# Patient Record
Sex: Female | Born: 1965 | Race: White | Hispanic: No | Marital: Single | Smoking: Current every day smoker
Health system: Southern US, Community
[De-identification: ages and names within clinical notes are randomized; demographics above are authoritative.]

## PROBLEM LIST (undated history)

## (undated) DIAGNOSIS — F329 Major depressive disorder, single episode, unspecified: Secondary | ICD-10-CM

## (undated) DIAGNOSIS — F32A Depression, unspecified: Secondary | ICD-10-CM

## (undated) DIAGNOSIS — M549 Dorsalgia, unspecified: Secondary | ICD-10-CM

## (undated) DIAGNOSIS — IMO0002 Reserved for concepts with insufficient information to code with codable children: Secondary | ICD-10-CM

## (undated) DIAGNOSIS — K279 Peptic ulcer, site unspecified, unspecified as acute or chronic, without hemorrhage or perforation: Secondary | ICD-10-CM

## (undated) DIAGNOSIS — F319 Bipolar disorder, unspecified: Secondary | ICD-10-CM

## (undated) DIAGNOSIS — G8929 Other chronic pain: Secondary | ICD-10-CM

## (undated) HISTORY — PX: TUBAL LIGATION: SHX77

---

## 2003-10-15 ENCOUNTER — Emergency Department (HOSPITAL_COMMUNITY): Admission: EM | Admit: 2003-10-15 | Discharge: 2003-10-15 | Payer: Self-pay | Admitting: Emergency Medicine

## 2003-12-09 ENCOUNTER — Emergency Department (HOSPITAL_COMMUNITY): Admission: EM | Admit: 2003-12-09 | Discharge: 2003-12-09 | Payer: Self-pay | Admitting: Emergency Medicine

## 2006-07-04 ENCOUNTER — Emergency Department (HOSPITAL_COMMUNITY): Admission: EM | Admit: 2006-07-04 | Discharge: 2006-07-04 | Payer: Self-pay | Admitting: Emergency Medicine

## 2007-03-03 ENCOUNTER — Emergency Department (HOSPITAL_COMMUNITY): Admission: EM | Admit: 2007-03-03 | Discharge: 2007-03-03 | Payer: Self-pay | Admitting: Emergency Medicine

## 2007-04-30 ENCOUNTER — Emergency Department (HOSPITAL_COMMUNITY): Admission: EM | Admit: 2007-04-30 | Discharge: 2007-04-30 | Payer: Self-pay | Admitting: Emergency Medicine

## 2007-07-19 ENCOUNTER — Emergency Department (HOSPITAL_COMMUNITY): Admission: EM | Admit: 2007-07-19 | Discharge: 2007-07-19 | Payer: Self-pay | Admitting: Emergency Medicine

## 2007-08-18 ENCOUNTER — Emergency Department (HOSPITAL_COMMUNITY): Admission: EM | Admit: 2007-08-18 | Discharge: 2007-08-19 | Payer: Self-pay | Admitting: Emergency Medicine

## 2007-10-03 ENCOUNTER — Ambulatory Visit: Payer: Self-pay | Admitting: Internal Medicine

## 2007-10-24 ENCOUNTER — Ambulatory Visit: Payer: Self-pay | Admitting: Internal Medicine

## 2007-10-24 ENCOUNTER — Ambulatory Visit (HOSPITAL_COMMUNITY): Admission: RE | Admit: 2007-10-24 | Discharge: 2007-10-24 | Payer: Self-pay | Admitting: Internal Medicine

## 2007-11-14 ENCOUNTER — Emergency Department (HOSPITAL_COMMUNITY): Admission: EM | Admit: 2007-11-14 | Discharge: 2007-11-15 | Payer: Self-pay | Admitting: Emergency Medicine

## 2007-11-16 ENCOUNTER — Emergency Department (HOSPITAL_COMMUNITY): Admission: EM | Admit: 2007-11-16 | Discharge: 2007-11-16 | Payer: Self-pay | Admitting: Emergency Medicine

## 2007-11-22 ENCOUNTER — Emergency Department (HOSPITAL_COMMUNITY): Admission: EM | Admit: 2007-11-22 | Discharge: 2007-11-22 | Payer: Self-pay | Admitting: Emergency Medicine

## 2008-02-04 ENCOUNTER — Emergency Department (HOSPITAL_COMMUNITY): Admission: EM | Admit: 2008-02-04 | Discharge: 2008-02-04 | Payer: Self-pay | Admitting: Emergency Medicine

## 2008-06-30 ENCOUNTER — Emergency Department (HOSPITAL_COMMUNITY): Admission: EM | Admit: 2008-06-30 | Discharge: 2008-06-30 | Payer: Self-pay | Admitting: Emergency Medicine

## 2008-09-22 ENCOUNTER — Emergency Department (HOSPITAL_COMMUNITY): Admission: EM | Admit: 2008-09-22 | Discharge: 2008-09-22 | Payer: Self-pay | Admitting: Emergency Medicine

## 2009-03-23 ENCOUNTER — Emergency Department (HOSPITAL_COMMUNITY): Admission: EM | Admit: 2009-03-23 | Discharge: 2009-03-23 | Payer: Self-pay | Admitting: Emergency Medicine

## 2009-07-01 ENCOUNTER — Ambulatory Visit (HOSPITAL_COMMUNITY): Admission: RE | Admit: 2009-07-01 | Discharge: 2009-07-01 | Payer: Self-pay | Admitting: Family Medicine

## 2009-09-12 ENCOUNTER — Emergency Department (HOSPITAL_COMMUNITY): Admission: EM | Admit: 2009-09-12 | Discharge: 2009-09-12 | Payer: Self-pay | Admitting: Emergency Medicine

## 2009-10-08 ENCOUNTER — Emergency Department (HOSPITAL_COMMUNITY): Admission: EM | Admit: 2009-10-08 | Discharge: 2009-10-08 | Payer: Self-pay | Admitting: Emergency Medicine

## 2010-03-18 ENCOUNTER — Emergency Department (HOSPITAL_COMMUNITY)
Admission: EM | Admit: 2010-03-18 | Discharge: 2010-03-18 | Payer: Self-pay | Source: Home / Self Care | Admitting: Emergency Medicine

## 2010-03-18 LAB — URINALYSIS, ROUTINE W REFLEX MICROSCOPIC
Bilirubin Urine: NEGATIVE
Ketones, ur: NEGATIVE mg/dL
Leukocytes, UA: NEGATIVE
Nitrite: NEGATIVE
Protein, ur: NEGATIVE mg/dL
Specific Gravity, Urine: 1.02 (ref 1.005–1.030)
Urine Glucose, Fasting: NEGATIVE mg/dL
Urobilinogen, UA: 0.2 mg/dL (ref 0.0–1.0)
pH: 6 (ref 5.0–8.0)

## 2010-03-18 LAB — URINE MICROSCOPIC-ADD ON

## 2010-06-20 LAB — URINE MICROSCOPIC-ADD ON

## 2010-06-20 LAB — URINALYSIS, ROUTINE W REFLEX MICROSCOPIC
Bilirubin Urine: NEGATIVE
Glucose, UA: NEGATIVE mg/dL
Ketones, ur: NEGATIVE mg/dL
Leukocytes, UA: NEGATIVE
Nitrite: NEGATIVE
Protein, ur: NEGATIVE mg/dL
Specific Gravity, Urine: 1.02 (ref 1.005–1.030)
Urobilinogen, UA: 0.2 mg/dL (ref 0.0–1.0)
pH: 6 (ref 5.0–8.0)

## 2010-06-23 LAB — URINALYSIS, ROUTINE W REFLEX MICROSCOPIC
Glucose, UA: NEGATIVE mg/dL
Ketones, ur: NEGATIVE mg/dL
Nitrite: POSITIVE — AB
Protein, ur: 100 mg/dL — AB
Specific Gravity, Urine: 1.025 (ref 1.005–1.030)
Urobilinogen, UA: 2 mg/dL — ABNORMAL HIGH (ref 0.0–1.0)
pH: 6 (ref 5.0–8.0)

## 2010-06-23 LAB — COMPREHENSIVE METABOLIC PANEL
ALT: 19 U/L (ref 0–35)
AST: 19 U/L (ref 0–37)
Albumin: 3 g/dL — ABNORMAL LOW (ref 3.5–5.2)
Alkaline Phosphatase: 56 U/L (ref 39–117)
BUN: 17 mg/dL (ref 6–23)
CO2: 24 mEq/L (ref 19–32)
Calcium: 9 mg/dL (ref 8.4–10.5)
Chloride: 95 mEq/L — ABNORMAL LOW (ref 96–112)
Creatinine, Ser: 1.32 mg/dL — ABNORMAL HIGH (ref 0.4–1.2)
GFR calc Af Amer: 53 mL/min — ABNORMAL LOW (ref >60)
GFR calc non Af Amer: 44 mL/min — ABNORMAL LOW (ref >60)
Glucose, Bld: 180 mg/dL — ABNORMAL HIGH (ref 70–99)
Potassium: 3.1 mEq/L — ABNORMAL LOW (ref 3.5–5.1)
Sodium: 131 mEq/L — ABNORMAL LOW (ref 135–145)
Total Bilirubin: 0.7 mg/dL (ref 0.3–1.2)
Total Protein: 7.1 g/dL (ref 6.0–8.3)

## 2010-06-23 LAB — DIFFERENTIAL
Basophils Absolute: 0 10*3/uL (ref 0.0–0.1)
Basophils Relative: 0 % (ref 0–1)
Eosinophils Absolute: 0 10*3/uL (ref 0.0–0.7)
Eosinophils Relative: 0 % (ref 0–5)
Lymphocytes Relative: 4 % — ABNORMAL LOW (ref 12–46)
Lymphs Abs: 0.4 10*3/uL — ABNORMAL LOW (ref 0.7–4.0)
Monocytes Absolute: 1.8 10*3/uL — ABNORMAL HIGH (ref 0.1–1.0)
Monocytes Relative: 15 % — ABNORMAL HIGH (ref 3–12)
Neutro Abs: 9.8 10*3/uL — ABNORMAL HIGH (ref 1.7–7.7)
Neutrophils Relative %: 82 % — ABNORMAL HIGH (ref 43–77)

## 2010-06-23 LAB — CBC
HCT: 41.9 % (ref 36.0–46.0)
Hemoglobin: 14.6 g/dL (ref 12.0–15.0)
MCHC: 34.9 g/dL (ref 30.0–36.0)
MCV: 104.5 fL — ABNORMAL HIGH (ref 78.0–100.0)
Platelets: 165 10*3/uL (ref 150–400)
RBC: 4.01 MIL/uL (ref 3.87–5.11)
RDW: 12.7 % (ref 11.5–15.5)
WBC: 12 10*3/uL — ABNORMAL HIGH (ref 4.0–10.5)

## 2010-06-23 LAB — URINE CULTURE: Colony Count: >=100000

## 2010-06-23 LAB — PROTIME-INR
INR: 1 (ref 0.00–1.49)
Prothrombin Time: 13 seconds (ref 11.6–15.2)

## 2010-06-23 LAB — CULTURE, BLOOD (ROUTINE X 2)
Culture: NO GROWTH
Culture: NO GROWTH
Report Status: 4242010
Report Status: 4242010

## 2010-06-23 LAB — URINE MICROSCOPIC-ADD ON

## 2010-06-23 LAB — LIPASE, BLOOD: Lipase: 21 U/L (ref 11–59)

## 2010-07-27 NOTE — Consult Note (Signed)
NAME:  Natalie Fleming, Natalie Fleming                  ACCOUNT NO.:  [id number]   MEDICAL RECORD NO.:  [id number]          PATIENT TYPE:  AMB   LOCATION:  DAY                           FACILITY:  [facility]   PHYSICIAN:  R. Roetta Sessions, M.D. DATE OF BIRTH:  26-Nov-1965   DATE OF CONSULTATION:  DATE OF DISCHARGE:                                 CONSULTATION   REQUESTING PHYSICIAN:  [name], nurse practitioner from  [name] Department.   REASON FOR CONSULTATION:  Significant weight loss, abdominal pain,  nausea, vomiting, and hematochezia.   HISTORY OF PRESENT ILLNESS:  Natalie Fleming is a 45 year old Caucasian female.  She tells me over the last 7 months, she has had significant GI  problems.  She complains of multiple symptoms including early a.m.  nausea that resolved throughout the day and returns again in the  evenings.  She complains of mid abdominal pain, which she describes as a  fire burning type pain, 10/10 on pain scale.  She has constant daily  pain.  She also notes cold chills.  She notes vomiting several times per  day.  She complains of anorexia.  She denies any dysphagia or  odynophagia.  She occasionally has heartburn and indigestion.  She takes  3-4 ibuprofen per day as well as Goody's and BC powders very regularly  for migraine headaches.  She occasionally notices small to moderate  amounts of bright red blood in her stool and has even noted dark stools,  which may have been melena, intermittently over the course of the last 7  months.  She complains of constipation which has been a chronic problem  for her.  She can go up to 3 days without a bowel movement and this  alternates between occasional diarrhea where she can have up to 4 loose  stools per day.  She has lost 22 pounds in the last 7 months.  This all  started with what she felt was a virus.  She is concerned because her  mother died of some type of abdominal cancer.  She thinks it may have  been gastric.  She had  surgery and developed a staph infection.  She has  tried Zantac, Tagamet, Maalox, and Mylanta, but is not getting complete  relief.   PAST MEDICAL HISTORY:  Tubal ligation.   LABORATORY STUDIES:  From September 17, 2007, showed a hemoglobin of 15.2,  hematocrit 46, MCV 105, WBC is 9.5, platelets 246, and glucose 114.  Otherwise, normal comprehensive metabolic panel.  Amylase and lipase are  normal.   CURRENT MEDICATIONS:  1. Ibuprofen 400 mg p.r.n.  2. BC powders p.r.n.   ALLERGIES:  No known drug allergies.   FAMILY HISTORY:  Positive for mother with some type of stomach cancer  and was succumbed to a staph infection postop.  Her father is alive at  age 69 with prostate cancer and diabetes mellitus.  She has lost one  sister due to gunshot.  She has one half-brother who is healthy.   SOCIAL HISTORY:  Natalie Fleming is separated.  She lives with her  significant  other.  She has 2 grown healthy daughters.  She lost her job at [employer]  last week.  She has a 2-pack year history of tobacco use.  She has a  history of heavy alcohol use previously for about 14 years.  Currently,  she tells me she only drinks a couple of beers per year.  She quit about  1 year ago.  She has history of IV drug use.  She has a 5-year history  of heavy drug use, but quit 20 years ago.   REVIEW OF SYSTEMS:  See HPI.  CONSTITUTIONAL:  She complains of fatigue  and myalgias.  She tells me she never feels well.   PHYSICAL EXAMINATION:  VITAL SIGNS:  Weight 106 pounds, height 64  inches, temperature 98.3, blood pressure 100/78, and pulse 96.  GENERAL:  Natalie Fleming is a thin, cachectic Caucasian female who is alert,  oriented, pleasant, and cooperative, in no acute distress.  HEENT:  Sclerae clear, nonicteric.  Conjunctivae pink.  Oropharynx pink  and moist without any lesions.  NECK:  Supple without mass or thyromegaly.  CHEST:  Heart regular rate and rhythm.  Normal S1 and S2 without  murmurs, clicks, rubs, or  gallops.  LUNGS:  Clear to auscultation bilaterally.  ABDOMEN:  She does have an umbilical ornament as well as lower abdominal  tattoo.  Abdomen has positive bowel sounds x4.  No bruits auscultated.  Soft and nondistended.  She does have mild tenderness to bilateral lower  quadrant in suprapubic area on deep palpation.  There is no rebound,  tenderness, or guarding.  No hepatosplenomegaly or mass.  EXTREMITIES:  Without clubbing or edema.   IMPRESSION:  Natalie Fleming is a 45 year old Caucasian female with significant  abdominal pain, heartburn, indigestion, and significant nonsteroidal  anti-inflammatory drug use including frequent BC or Goody's powders.  She is definitely at risk for peptic ulcer disease or nonsteroidal  antiinflammatory drug-induced damage throughout her entire  gastrointestinal tract.  It is worrisome that she has had a 22-pound  weight loss in the past 7 months.  She also has intermittent  hematochezia, possible history of melena.  We also need to rule out  inflammatory bowel disease in this lady as well as malignancy.   PLAN:  1. Urine pregnancy.  2. Nexium 40 mg daily.  I have given her 2 weeks worth of samples.  3. She is to avoid ibuprofen, Goody's, BC's, or any other NSAIDs and      use Tylenol instead for her headaches or talk with [name],      nurse practitioner, about further management.  4. Colonoscopy and EGD with Dr. Jena Gauss in the OR with propofol given      her history of polysubstance abuse.  I have discussed both      procedures including risks and benefits including but not limited      to bleeding, infection, perforation, and drug reaction.  She agreed      with the plan and consent was obtained.      Natalie Fleming, N.P.      Natalie Fleming, M.D.  Electronically Signed    KJ/MEDQ  D:  10/03/2007  T:  10/04/2007  Job:  [id number]

## 2010-07-27 NOTE — Op Note (Signed)
NAME:  Natalie Fleming, Natalie Fleming                  ACCOUNT NO.:  [id number]   MEDICAL RECORD NO.:  [id number]          PATIENT TYPE:  AMB   LOCATION:  DAY                           FACILITY:  [facility]   PHYSICIAN:  R. Roetta Sessions, M.D. DATE OF BIRTH:  16-Mar-1965   DATE OF PROCEDURE:  10/24/2007  DATE OF DISCHARGE:                               OPERATIVE REPORT   Diagnostic EGD followed by iliac colonoscopy diagnostic.   INDICATIONS FOR PROCEDURE:  A 45 year old lady with abdominal pain,  reflux symptoms, dyspepsia, and the same significant anti-inflammatory  drug use including BCs and Goody's powders.  She also has intermittent  hematochezia and consequently EGD and colonoscopy not been done.  Risks,  benefits, and limitations have been reviewed.  Questions answered.  Urine pregnancy test was negative through our office.  She was given a 2-  week supply of Nexium 40 mg orally daily, which she took and her upper  GI tract symptoms improved significantly while on this regimen, then she  ran out.   PROCEDURE NOTE:  O2 saturation, blood pressure, pulse, and respirations  were monitored throughout the entire procedure.   CONSCIOUS SEDATION/PROPOFOL:  Per anesthesia, Cetacaine spray and  topical pharyngeal anesthesia.   INSTRUMENTATION:  Pentax video chip system.   EGD FINDINGS:  Examination of tubular esophagus revealed entirely normal  esophageal mucosa.  EG junction easily traversed.  Stomach:  All gastric  cavity was emptied and insufflated well with air.  A thorough  examination of  the gastric mucosa including retroflexion of proximal  stomach and esophagogastric junction demonstrated normal-appearing  gastric mucosa.  There was no hiatal hernia.  Pylorus patent and easily  traversed.  Examination of bulb and second portion revealed erosions in  the posterior bulb where some mucosal edema.  I was unable to identify a  ulcer or other abnormality.  The patient tolerated the procedure well  and was  prepared for colonoscopy.  Digital rectal exam revealed external  hemorrhoidal tags only.  Endoscopic findings, the prep was good.  Colon:  Colonic mucosa was surveyed from the rectosigmoid junction through the  left transverse, right colon to the appendiceal orifice, ileocecal  valve, and cecum.  These structures well seen and photographed for the  record.  Terminal ileum was intubated to 10 cm from this level.  The  scope was slowly withdrawn.  All previously mentioned mucosal surfaces  were again seen.  The colonic mucosa appeared normal as did terminal  ileum mucosa.  Scope was pulled down the rectum.  A thorough examination  of the rectal mucosa including retroflex view of the anal verge  demonstrated no abnormalities.  The patient tolerated the procedure well  as reactive endoscopy.   IMPRESSION:  EGD normal esophagus, stomach bulbar erosion/edema,  otherwise normal.  D1 and D2.  Colonoscopy findings, external  hemorrhoidal tags, otherwise normal rectum, colon, terminal ileum.   RECOMMENDATIONS:  1. Would avoid NSAID/headache powders.  2. Resume Nexium 40 mg orally daily.  3. Check H. pylori serologies for completion.  4. Hemorrhoid literature/Anusol-HC Suppository 1 per rectum at bedtime  times 10 days.  5. Follow up with [place] in 3 months.      [name], M.D.  Electronically Signed     RMR/MEDQ  D:  10/24/2007  T:  10/24/2007  Job:  [id number]

## 2010-12-09 LAB — COMPREHENSIVE METABOLIC PANEL
ALT: 26
AST: 24
Albumin: 3.5
Alkaline Phosphatase: 60
BUN: 9
CO2: 23
Calcium: 8.9
Chloride: 107
Creatinine, Ser: 0.88
GFR calc Af Amer: >60
GFR calc non Af Amer: >60
Glucose, Bld: 86
Potassium: 3.7
Sodium: 138
Total Bilirubin: 1
Total Protein: 6.2

## 2010-12-09 LAB — DIFFERENTIAL
Basophils Absolute: 0
Basophils Relative: 0
Eosinophils Absolute: 0
Eosinophils Relative: 0
Lymphocytes Relative: 24
Lymphs Abs: 2.5
Monocytes Absolute: 0.8
Monocytes Relative: 8
Neutro Abs: 7
Neutrophils Relative %: 68

## 2010-12-09 LAB — CBC
HCT: 38.2
Hemoglobin: 13.8
MCHC: 36.1 — ABNORMAL HIGH
MCV: 101.1 — ABNORMAL HIGH
Platelets: 203
RBC: 3.78 — ABNORMAL LOW
RDW: 13.3
WBC: 10.4

## 2010-12-10 LAB — DRUG SCREEN PANEL (SERUM)
Barbiturate Scrn: NEGATIVE
Cocaine (Metabolite): NEGATIVE
Opiates, Blood: NEGATIVE
Propoxyphene,Serum: NEGATIVE

## 2010-12-10 LAB — HEMOGLOBIN AND HEMATOCRIT, BLOOD
HCT: 39.2
Hemoglobin: 13.4

## 2010-12-10 LAB — BASIC METABOLIC PANEL
CO2: 25
Chloride: 106
GFR calc Af Amer: >60
Sodium: 136

## 2010-12-10 LAB — H. PYLORI ANTIBODY, IGG: H Pylori IgG: 0.5

## 2010-12-17 LAB — BASIC METABOLIC PANEL
Chloride: 104
Creatinine, Ser: 0.82
GFR calc Af Amer: >60
Potassium: 4.6

## 2011-02-23 ENCOUNTER — Emergency Department (HOSPITAL_COMMUNITY)
Admission: EM | Admit: 2011-02-23 | Discharge: 2011-02-23 | Disposition: A | Discharge status: Home / Self Care | Payer: Self-pay | Attending: Emergency Medicine | Admitting: Emergency Medicine

## 2011-02-23 ENCOUNTER — Encounter: Payer: Self-pay | Admitting: *Deleted

## 2011-02-23 ENCOUNTER — Emergency Department (HOSPITAL_COMMUNITY): Payer: Self-pay

## 2011-02-23 DIAGNOSIS — R5383 Other fatigue: Secondary | ICD-10-CM | POA: Insufficient documentation

## 2011-02-23 DIAGNOSIS — R5381 Other malaise: Secondary | ICD-10-CM | POA: Insufficient documentation

## 2011-02-23 DIAGNOSIS — M549 Dorsalgia, unspecified: Secondary | ICD-10-CM

## 2011-02-23 DIAGNOSIS — F17200 Nicotine dependence, unspecified, uncomplicated: Secondary | ICD-10-CM | POA: Insufficient documentation

## 2011-02-23 DIAGNOSIS — Z87828 Personal history of other (healed) physical injury and trauma: Secondary | ICD-10-CM | POA: Insufficient documentation

## 2011-02-23 DIAGNOSIS — IMO0002 Reserved for concepts with insufficient information to code with codable children: Secondary | ICD-10-CM | POA: Insufficient documentation

## 2011-02-23 DIAGNOSIS — M545 Low back pain, unspecified: Secondary | ICD-10-CM | POA: Insufficient documentation

## 2011-02-23 DIAGNOSIS — M5137 Other intervertebral disc degeneration, lumbosacral region: Secondary | ICD-10-CM | POA: Insufficient documentation

## 2011-02-23 DIAGNOSIS — M51379 Other intervertebral disc degeneration, lumbosacral region without mention of lumbar back pain or lower extremity pain: Secondary | ICD-10-CM | POA: Insufficient documentation

## 2011-02-23 MED ORDER — PREDNISONE 20 MG PO TABS
60.0000 mg | ORAL_TABLET | Freq: Once | ORAL | Status: AC
  Administered 2011-02-23: 60 mg via ORAL
  Filled 2011-02-23: qty 3

## 2011-02-23 MED ORDER — DIAZEPAM 5 MG PO TABS
5.0000 mg | ORAL_TABLET | Freq: Once | ORAL | Status: AC
  Administered 2011-02-23: 5 mg via ORAL
  Filled 2011-02-23: qty 1

## 2011-02-23 MED ORDER — PREDNISONE 10 MG PO TABS: ORAL_TABLET | ORAL | Status: DC

## 2011-02-23 MED ORDER — METHOCARBAMOL 500 MG PO TABS: ORAL_TABLET | ORAL | Status: DC

## 2011-02-23 MED ORDER — MELOXICAM 7.5 MG PO TABS: ORAL_TABLET | ORAL | Status: DC

## 2011-02-23 MED ORDER — IBUPROFEN 800 MG PO TABS
800.0000 mg | ORAL_TABLET | Freq: Once | ORAL | Status: AC
  Administered 2011-02-23: 800 mg via ORAL
  Filled 2011-02-23: qty 1
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
## 2011-02-23 NOTE — ED Notes (Signed)
Left in c/o family for transport home; alert, in no distress; instructions given and reviewed with f/u information provided.  Verbalizes understanding of instructions given.

## 2011-02-23 NOTE — ED Notes (Signed)
C/o mid-back pain x 1 month, worse upon waking this morning; denies injury.

## 2011-02-25 NOTE — ED Provider Notes (Signed)
Evaluation and management procedures were performed by the PA/NP under my supervision/collaboration.    Felisa Bonier, MD 02/25/11 [phone]

## 2011-06-14 ENCOUNTER — Other Ambulatory Visit (HOSPITAL_COMMUNITY): Payer: Self-pay | Admitting: Family Medicine

## 2011-06-14 DIAGNOSIS — R52 Pain, unspecified: Secondary | ICD-10-CM

## 2011-06-14 DIAGNOSIS — Z139 Encounter for screening, unspecified: Secondary | ICD-10-CM

## 2011-06-29 ENCOUNTER — Encounter (HOSPITAL_COMMUNITY): Payer: Self-pay

## 2011-08-04 ENCOUNTER — Emergency Department (HOSPITAL_COMMUNITY): Payer: Self-pay

## 2011-08-04 ENCOUNTER — Encounter (HOSPITAL_COMMUNITY): Payer: Self-pay | Admitting: *Deleted

## 2011-08-04 ENCOUNTER — Emergency Department (HOSPITAL_COMMUNITY)
Admission: EM | Admit: 2011-08-04 | Discharge: 2011-08-04 | Disposition: A | Discharge status: Home / Self Care | Payer: Self-pay | Attending: Emergency Medicine | Admitting: Emergency Medicine

## 2011-08-04 DIAGNOSIS — IMO0002 Reserved for concepts with insufficient information to code with codable children: Secondary | ICD-10-CM | POA: Insufficient documentation

## 2011-08-04 DIAGNOSIS — R109 Unspecified abdominal pain: Secondary | ICD-10-CM | POA: Insufficient documentation

## 2011-08-04 DIAGNOSIS — R509 Fever, unspecified: Secondary | ICD-10-CM | POA: Insufficient documentation

## 2011-08-04 DIAGNOSIS — Z79899 Other long term (current) drug therapy: Secondary | ICD-10-CM | POA: Insufficient documentation

## 2011-08-04 DIAGNOSIS — R300 Dysuria: Secondary | ICD-10-CM | POA: Insufficient documentation

## 2011-08-04 DIAGNOSIS — Z8711 Personal history of peptic ulcer disease: Secondary | ICD-10-CM | POA: Insufficient documentation

## 2011-08-04 DIAGNOSIS — R61 Generalized hyperhidrosis: Secondary | ICD-10-CM | POA: Insufficient documentation

## 2011-08-04 DIAGNOSIS — R112 Nausea with vomiting, unspecified: Secondary | ICD-10-CM | POA: Insufficient documentation

## 2011-08-04 DIAGNOSIS — N12 Tubulo-interstitial nephritis, not specified as acute or chronic: Secondary | ICD-10-CM | POA: Insufficient documentation

## 2011-08-04 DIAGNOSIS — Z91148 Patient's other noncompliance with medication regimen for other reason: Secondary | ICD-10-CM | POA: Insufficient documentation

## 2011-08-04 HISTORY — DX: Peptic ulcer, site unspecified, unspecified as acute or chronic, without hemorrhage or perforation: K27.9

## 2011-08-04 HISTORY — DX: Reserved for concepts with insufficient information to code with codable children: IMO0002

## 2011-08-04 LAB — DIFFERENTIAL
Basophils Absolute: 0 10*3/uL (ref 0.0–0.1)
Basophils Relative: 0 % (ref 0–1)
Monocytes Relative: 9 % (ref 3–12)
Neutro Abs: 14 10*3/uL — ABNORMAL HIGH (ref 1.7–7.7)
Neutrophils Relative %: 82 % — ABNORMAL HIGH (ref 43–77)

## 2011-08-04 LAB — URINALYSIS, ROUTINE W REFLEX MICROSCOPIC
Glucose, UA: NEGATIVE mg/dL
Leukocytes, UA: NEGATIVE
Protein, ur: 100 mg/dL — AB

## 2011-08-04 LAB — URINE MICROSCOPIC-ADD ON

## 2011-08-04 LAB — CBC
Hemoglobin: 13.6 g/dL (ref 12.0–15.0)
MCHC: 33.9 g/dL (ref 30.0–36.0)
Platelets: 194 10*3/uL (ref 150–400)
RDW: 12.1 % (ref 11.5–15.5)

## 2011-08-04 LAB — BASIC METABOLIC PANEL
Chloride: 98 mEq/L (ref 96–112)
GFR calc Af Amer: 89 mL/min — ABNORMAL LOW (ref >90)
Potassium: 3.8 mEq/L (ref 3.5–5.1)
Sodium: 137 mEq/L (ref 135–145)

## 2011-08-04 LAB — PREGNANCY, URINE: Preg Test, Ur: NEGATIVE

## 2011-08-04 MED ORDER — HYDROMORPHONE HCL PF 1 MG/ML IJ SOLN
1.0000 mg | Freq: Once | INTRAMUSCULAR | Status: AC
  Administered 2011-08-04: 1 mg via INTRAVENOUS
  Filled 2011-08-04: qty 1

## 2011-08-04 MED ORDER — KETOROLAC TROMETHAMINE 30 MG/ML IJ SOLN
30.0000 mg | Freq: Once | INTRAMUSCULAR | Status: AC
  Administered 2011-08-04: 30 mg via INTRAVENOUS
  Filled 2011-08-04: qty 1

## 2011-08-04 MED ORDER — SODIUM CHLORIDE 0.9 % IV BOLUS (SEPSIS)
1000.0000 mL | Freq: Once | INTRAVENOUS | Status: AC
  Administered 2011-08-04: 1000 mL via INTRAVENOUS

## 2011-08-04 MED ORDER — ONDANSETRON HCL 4 MG PO TABS: 4.0000 mg | ORAL_TABLET | Freq: Four times a day (QID) | ORAL | Status: AC

## 2011-08-04 MED ORDER — CEPHALEXIN 500 MG PO CAPS: 500.0000 mg | ORAL_CAPSULE | Freq: Four times a day (QID) | ORAL | Status: AC

## 2011-08-04 MED ORDER — ONDANSETRON HCL 4 MG/2ML IJ SOLN
4.0000 mg | Freq: Once | INTRAMUSCULAR | Status: AC
  Administered 2011-08-04: 4 mg via INTRAVENOUS
  Filled 2011-08-04: qty 2

## 2011-08-04 MED ORDER — HYDROCODONE-ACETAMINOPHEN 5-325 MG PO TABS: 2.0000 | ORAL_TABLET | ORAL | Status: AC | PRN

## 2011-08-04 MED ORDER — ACETAMINOPHEN 500 MG PO TABS
1000.0000 mg | ORAL_TABLET | Freq: Once | ORAL | Status: AC
  Administered 2011-08-04: 1000 mg via ORAL
  Filled 2011-08-04: qty 2

## 2011-08-04 MED ORDER — DEXTROSE 5 % IV SOLN
1.0000 g | Freq: Once | INTRAVENOUS | Status: AC
  Administered 2011-08-04: 1 g via INTRAVENOUS
  Filled 2011-08-04: qty 10

## 2011-08-04 NOTE — ED Notes (Signed)
Pain lt flank, dysuria, and urine has "bad odor". Nausea, vomited x2 , no diarrhea..  Fever,

## 2011-08-04 NOTE — ED Notes (Signed)
Left flank pain began yesterday.

## 2011-08-04 NOTE — ED Provider Notes (Signed)
History   This chart was scribed for Natalie Octave, MD by Brooks Sailors. The patient was seen in room APA01/APA01. Patient's care was started at 1402.   CSN: [id number]  Arrival date & time 08/04/11  1402   First MD Initiated Contact with Patient 08/04/11 1508      Chief Complaint  Patient presents with  . Flank Pain    (Consider location/radiation/quality/duration/timing/severity/associated sxs/prior treatment) HPI  DEEPTI Fleming is a 46 y.o. female who presents to the Emergency Department complaining of constant left flank pain onset yesterday and worsening since. Pt has associated symptoms of nausea, vomiting, dysuria, fever, diaphoresis and chills. Pt vomited, yesterday but none todayPt describes the pain as stabbing and says it also moves to the left abdomen. Pt denies hematuria, vaginal discharge. Pt with history of kidney stones which she passed.     Past Medical History  Diagnosis Date  . Peptic ulcer   . Degenerative disc disease   . Degenerative disc disease   . Degenerative disc disease   . Degenerative disc disease   . Degenerative disc disease     Past Surgical History  Procedure Date  . Tubal ligation     History reviewed. No pertinent family history.  History  Substance Use Topics  . Smoking status: Current Everyday Smoker    Types: Cigarettes  . Smokeless tobacco: Not on file  . Alcohol Use: No    OB History    Grav Para Term Preterm Abortions TAB SAB Ect Mult Living                  Review of Systems  All other systems reviewed and are negative.    Allergies  Review of patient's allergies indicates no known allergies.  Home Medications   Current Outpatient Rx  Name Route Sig Dispense Refill  . CITALOPRAM HYDROBROMIDE 20 MG PO TABS Oral Take 20 mg by mouth daily.    . IBUPROFEN 200 MG PO TABS Oral Take 400-800 mg by mouth every 6 (six) hours as needed. For pain    . ICY HOT EX Apply externally Apply 1 application topically as  needed. For pain in back    . TRAZODONE HCL 100 MG PO TABS Oral Take 100 mg by mouth at bedtime.    . CEPHALEXIN 500 MG PO CAPS Oral Take 1 capsule (500 mg total) by mouth 4 (four) times daily. 40 capsule 0  . HYDROCODONE-ACETAMINOPHEN 5-325 MG PO TABS Oral Take 2 tablets by mouth every 4 (four) hours as needed for pain. 10 tablet 0  . ONDANSETRON HCL 4 MG PO TABS Oral Take 1 tablet (4 mg total) by mouth every 6 (six) hours. 12 tablet 0    BP 100/48  Pulse 108  Temp(Src) 99.4 F (37.4 C) (Oral)  Resp 22  Ht 5\' 4"  (1.626 m)  Wt 110 lb (49.896 kg)  BMI 18.88 kg/m2  SpO2 97%  LMP 07/28/2011  Physical Exam  Nursing note and vitals reviewed. Constitutional: She is oriented to person, place, and time. She appears well-developed and well-nourished.  HENT:  Head: Normocephalic and atraumatic.  Eyes: Conjunctivae and EOM are normal. Pupils are equal, round, and reactive to light.  Neck: Normal range of motion. Neck supple.  Cardiovascular: Normal rate and regular rhythm.   Pulmonary/Chest: Effort normal and breath sounds normal.  Abdominal: Soft. Bowel sounds are normal. There is tenderness in the left lower quadrant. There is CVA tenderness.  Musculoskeletal: Normal range of motion.  Neurological: She is alert and oriented to person, place, and time.  Skin: Skin is warm and dry.  Psychiatric: She has a normal mood and affect.    ED Course  Procedures (including critical care time)   1515 Patient informed of current plan for treatment and evaluation and agrees with plan at this time.  1720 Pelvic exam performed, chaperone present at exam.  1821 patient to be discharged   Labs Reviewed  URINALYSIS, ROUTINE W REFLEX MICROSCOPIC - Abnormal; Notable for the following:    APPearance HAZY (*)    Hgb urine dipstick LARGE (*)    Bilirubin Urine SMALL (*)    Protein, ur 100 (*)    All other components within normal limits  CBC - Abnormal; Notable for the following:    WBC 17.2 (*)     MCV 102.0 (*)    MCH 34.6 (*)    All other components within normal limits  DIFFERENTIAL - Abnormal; Notable for the following:    Neutrophils Relative 82 (*)    Neutro Abs 14.0 (*)    Lymphocytes Relative 9 (*)    Monocytes Absolute 1.6 (*)    All other components within normal limits  BASIC METABOLIC PANEL - Abnormal; Notable for the following:    Glucose, Bld 117 (*)    GFR calc non Af Amer 77 (*)    GFR calc Af Amer 89 (*)    All other components within normal limits  URINE MICROSCOPIC-ADD ON - Abnormal; Notable for the following:    Squamous Epithelial / LPF FEW (*)    Bacteria, UA MANY (*)    All other components within normal limits  PREGNANCY, URINE  GC/CHLAMYDIA PROBE AMP, GENITAL  URINE CULTURE   Ct Abdomen Pelvis Wo Contrast  08/04/2011  *RADIOLOGY REPORT*  Clinical Data: Left flank pain.  Urolithiasis.  CT ABDOMEN AND PELVIS WITHOUT CONTRAST  Technique:  Multidetector CT imaging of the abdomen and pelvis was performed following the standard protocol without intravenous contrast.  Comparison: 06/30/2008  Findings: Several small nonobstructing bilateral intrarenal calculi are demonstrated.  Asymmetric left renal enlargement is again demonstrated but unchanged in appearance since previous study. There is no evidence of ureteral calculi or hydronephrosis.  No calculi seen within the urinary bladder.  There has been further enlargement of the left pelvic mass since previous study which appears contiguous with the uterus.  This measures approximately 8.5 x 6.4 cm and is slowly increased in size from 7.6 x 5.2 cm on previous study.  This is consistent with an enlarging subserosal or pedunculated fibroid.  No other pelvic masses are seen, and no pelvic lymphadenopathy is identified.  No evidence of ascites.  The other abdominal parenchymal organs have a normal appearance on this noncontrast study.  Gallbladder is unremarkable.  The no focal inflammatory changes or abnormal fluid collection  are seen.  No evidence of dilated bowel loops.  IMPRESSION:  1.  Bilateral nephrolithiasis.  No evidence of ureteral calculi or hydronephrosis. 2.  Stable asymmetric enlargement of the left kidney.  Recurrent acute pyelonephritis cannot definitely be excluded; recommend correlation with urinalysis. 3.  Mild increase in size of 8 cm left uterine fibroid.  Original Report Authenticated By: Danae Orleans, M.D.     1. Pyelonephritis       MDM  Left-sided flank pain radiating to left lower quadrant for the past 2 days associated with nausea, vomiting, subjective fever and chills. Also has had dark urine. History kidney stones it did  not require intervention. No vaginal bleeding or discharge.  Infected UA. No cervical motion tenderness on exam. We'll treat for pyelonephritis.  Improvement in heart rate with IV fluids, improvement in temperature. Tolerating PO, no vomiting in ED>    I personally performed the services described in this documentation, which was scribed in my presence.  The recorded information has been reviewed and considered.    Natalie Octave, MD 08/05/11 [phone]

## 2011-08-04 NOTE — Discharge Instructions (Signed)
Pyelonephritis, Adult Pyelonephritis is a kidney infection. In general, there are 2 main types of pyelonephritis:  Infections that come on quickly without any warning (acute pyelonephritis).   Infections that persist for a long period of time (chronic pyelonephritis).  CAUSES  Two main causes of pyelonephritis are:  Bacteria traveling from the bladder to the kidney. This is a problem especially in pregnant women. The urine in the bladder can become filled with bacteria from multiple causes, including:   Inflammation of the prostate gland (prostatitis).   Sexual intercourse in females.   Bladder infection (cystitis).   Bacteria traveling from the bloodstream to the tissue part of the kidney.  Problems that may increase your risk of getting a kidney infection include:  Diabetes.   Kidney stones or bladder stones.   Cancer.   Catheters placed in the bladder.   Other abnormalities of the kidney or ureter.  SYMPTOMS   Abdominal pain.   Pain in the side or flank area.   Fever.   Chills.   Upset stomach.   Blood in the urine (dark urine).   Frequent urination.   Strong or persistent urge to urinate.   Burning or stinging when urinating.  DIAGNOSIS  Your caregiver may diagnose your kidney infection based on your symptoms. A urine sample may also be taken. TREATMENT  In general, treatment depends on how severe the infection is.   If the infection is mild and caught early, your caregiver may treat you with oral antibiotics and send you home.   If the infection is more severe, the bacteria may have gotten into the bloodstream. This will require intravenous (IV) antibiotics and a hospital stay. Symptoms may include:   High fever.   Severe flank pain.   Shaking chills.   Even after a hospital stay, your caregiver may require you to be on oral antibiotics for a period of time.   Other treatments may be required depending upon the cause of the infection.  HOME CARE  INSTRUCTIONS   Take your antibiotics as directed. Finish them even if you start to feel better.   Make an appointment to have your urine checked to make sure the infection is gone.   Drink enough fluids to keep your urine clear or pale yellow.   Take medicines for the bladder if you have urgency and frequency of urination as directed by your caregiver.  SEEK IMMEDIATE MEDICAL CARE IF:   You have a fever.   You are unable to take your antibiotics or fluids.   You develop shaking chills.   You experience extreme weakness or fainting.   There is no improvement after 2 days of treatment.  MAKE SURE YOU:  Understand these instructions.   Will watch your condition.   Will get help right away if you are not doing well or get worse.  Document Released: 02/28/2005 Document Revised: 02/17/2011 Document Reviewed: 08/04/2010 [employer] Patient Information 2012 [employer]. 

## 2011-08-04 NOTE — ED Notes (Signed)
States she is feeling better at present, able to void

## 2011-08-06 LAB — URINE CULTURE

## 2011-08-07 NOTE — ED Notes (Signed)
+  Urine. Patient treated with Keflex. Sensitive to same. Per protocol MD. °

## 2011-08-10 ENCOUNTER — Inpatient Hospital Stay (HOSPITAL_COMMUNITY): Admission: RE | Admit: 2011-08-10 | Payer: Self-pay | Source: Ambulatory Visit

## 2011-08-31 ENCOUNTER — Other Ambulatory Visit (HOSPITAL_COMMUNITY): Payer: Self-pay | Admitting: Family Medicine

## 2011-08-31 ENCOUNTER — Ambulatory Visit (HOSPITAL_COMMUNITY)
Admission: RE | Admit: 2011-08-31 | Discharge: 2011-08-31 | Disposition: A | Discharge status: Home / Self Care | Payer: PRIVATE HEALTH INSURANCE | Source: Ambulatory Visit | Attending: Family Medicine | Admitting: Family Medicine

## 2011-08-31 DIAGNOSIS — R52 Pain, unspecified: Secondary | ICD-10-CM

## 2011-08-31 DIAGNOSIS — N644 Mastodynia: Secondary | ICD-10-CM | POA: Insufficient documentation

## 2011-08-31 DIAGNOSIS — N63 Unspecified lump in unspecified breast: Secondary | ICD-10-CM | POA: Insufficient documentation

## 2011-12-26 ENCOUNTER — Encounter (HOSPITAL_COMMUNITY): Payer: Self-pay | Admitting: *Deleted

## 2011-12-26 ENCOUNTER — Emergency Department (HOSPITAL_COMMUNITY)
Admission: EM | Admit: 2011-12-26 | Discharge: 2011-12-26 | Disposition: A | Discharge status: Home / Self Care | Payer: Self-pay | Attending: Emergency Medicine | Admitting: Emergency Medicine

## 2011-12-26 DIAGNOSIS — J329 Chronic sinusitis, unspecified: Secondary | ICD-10-CM | POA: Insufficient documentation

## 2011-12-26 DIAGNOSIS — F17200 Nicotine dependence, unspecified, uncomplicated: Secondary | ICD-10-CM | POA: Insufficient documentation

## 2011-12-26 HISTORY — DX: Other chronic pain: G89.29

## 2011-12-26 HISTORY — DX: Dorsalgia, unspecified: M54.9

## 2011-12-26 MED ORDER — PENICILLIN V POTASSIUM 500 MG PO TABS: 500.0000 mg | ORAL_TABLET | Freq: Four times a day (QID) | ORAL | Status: AC

## 2011-12-26 MED ORDER — IBUPROFEN 800 MG PO TABS
800.0000 mg | ORAL_TABLET | Freq: Once | ORAL | Status: AC
  Administered 2011-12-26: 800 mg via ORAL
  Filled 2011-12-26: qty 1

## 2011-12-26 MED ORDER — HYDROCODONE-ACETAMINOPHEN 5-325 MG PO TABS: 1.0000 | ORAL_TABLET | ORAL | Status: AC | PRN

## 2011-12-26 MED ORDER — HYDROCODONE-ACETAMINOPHEN 5-325 MG PO TABS
2.0000 | ORAL_TABLET | Freq: Once | ORAL | Status: AC
  Administered 2011-12-26: 2 via ORAL
  Filled 2011-12-26: qty 2

## 2011-12-26 MED ORDER — IBUPROFEN 800 MG PO TABS: 800.0000 mg | ORAL_TABLET | Freq: Three times a day (TID) | ORAL | Status: DC

## 2011-12-26 MED ORDER — PENICILLIN V POTASSIUM 250 MG PO TABS
500.0000 mg | ORAL_TABLET | Freq: Once | ORAL | Status: AC
  Administered 2011-12-26: 500 mg via ORAL
  Filled 2011-12-26: qty 2

## 2011-12-26 NOTE — ED Notes (Signed)
Pt reports pain on entire right side of face. Reporting pressure in ear and behind right eye.  Reports symptoms x2 months.

## 2011-12-26 NOTE — ED Notes (Signed)
Pt c/o facial pain pt states she has been hurting about 3 months. Pt states states she had tooth pulled on the right side and pain radiating from site.

## 2011-12-26 NOTE — ED Provider Notes (Signed)
History     CSN: [id number]  Arrival date & time 12/26/11  0410   First MD Initiated Contact with Patient 12/26/11 [phone]      Chief Complaint  Patient presents with  . Facial Pain    (Consider location/radiation/quality/duration/timing/severity/associated sxs/prior treatment) HPI Natalie Fleming is a 46 y.o. female who presents to the Emergency Department complaining of right sided facial pain x 3 months, worse in the last few days. Has right sided nasal congestion with heavy yellow mucus. Denies fever, chills, nausea, vomiting.    Past Medical History  Diagnosis Date  . Peptic ulcer   . Degenerative disc disease   . Degenerative disc disease   . Degenerative disc disease   . Degenerative disc disease   . Degenerative disc disease   . Back pain, chronic     Past Surgical History  Procedure Date  . Tubal ligation     History reviewed. No pertinent family history.  History  Substance Use Topics  . Smoking status: Current Every Day Smoker -- 0.2 packs/day    Types: Cigarettes  . Smokeless tobacco: Not on file  . Alcohol Use: No    OB History    Grav Para Term Preterm Abortions TAB SAB Ect Mult Living                  Review of Systems  Constitutional: Negative for fever.       10 Systems reviewed and are negative for acute change except as noted in the HPI.  HENT: Positive for congestion.        Facial pain  Eyes: Negative for discharge and redness.  Respiratory: Negative for cough and shortness of breath.   Cardiovascular: Negative for chest pain.  Gastrointestinal: Negative for vomiting and abdominal pain.  Musculoskeletal: Negative for back pain.  Skin: Negative for rash.  Neurological: Negative for syncope, numbness and headaches.  Psychiatric/Behavioral:       No behavior change.    Allergies  Review of patient's allergies indicates no known allergies.  Home Medications   Current Outpatient Rx  Name Route Sig Dispense Refill  . CITALOPRAM  HYDROBROMIDE 20 MG PO TABS Oral Take 20 mg by mouth daily.    . IBUPROFEN 200 MG PO TABS Oral Take 400-800 mg by mouth every 6 (six) hours as needed. For pain    . ICY HOT EX Apply externally Apply 1 application topically as needed. For pain in back    . TRAZODONE HCL 100 MG PO TABS Oral Take 100 mg by mouth at bedtime.      BP 122/77  Pulse 104  Temp 97.9 F (36.6 C) (Oral)  Resp 20  Ht 5\' 4"  (1.626 m)  Wt 112 lb (50.803 kg)  BMI 19.22 kg/m2  SpO2 100%  LMP 12/25/2011  Physical Exam  Nursing note and vitals reviewed. Constitutional: She appears well-developed and well-nourished.       Awake, alert, nontoxic appearance.  HENT:  Head: Normocephalic and atraumatic.  Right Ear: External ear normal.  Left Ear: External ear normal.  Mouth/Throat: Oropharynx is clear and moist.       Tenderness to palpation over right maxillary sinus  Eyes: Conjunctivae normal and EOM are normal. Pupils are equal, round, and reactive to light. Right eye exhibits no discharge. Left eye exhibits no discharge.  Neck: Neck supple.  Cardiovascular: Normal heart sounds.   Pulmonary/Chest: Effort normal and breath sounds normal. She exhibits no tenderness.  Abdominal: Soft. There is no  tenderness. There is no rebound.  Musculoskeletal: She exhibits no tenderness.       Baseline ROM, no obvious new focal weakness.  Neurological:       Mental status and motor strength appears baseline for patient and situation.  Skin: No rash noted.  Psychiatric: She has a normal mood and affect.    ED Course  Procedures (including critical care time)  Labs Reviewed - No data to display No results found.   No diagnosis found.    MDM  Patient with right facial tenderness and associated with tenderness over the right maxillary sinus. Initiated antibiotic therapy. Given analgesic and antiinflammatory.  Pt stable in ED with no significant deterioration in condition.The patient appears reasonably screened and/or  stabilized for discharge and I doubt any other medical condition or other [facility] requiring further screening, evaluation, or treatment in the ED at this time prior to discharge.  MDM Reviewed: nursing note and vitals           Nicoletta Dress. Colon Branch, MD 12/26/11 [phone]

## 2012-01-09 IMAGING — CR DG FOOT COMPLETE 3+V*R*
3 series · 3 of 3 positions shown · non-contrast
Comparison: None.

CLINICAL DATA: Laceration, pain

RIGHT FOOT COMPLETE - 3+ VIEW

[view not recorded (1 of 3)]
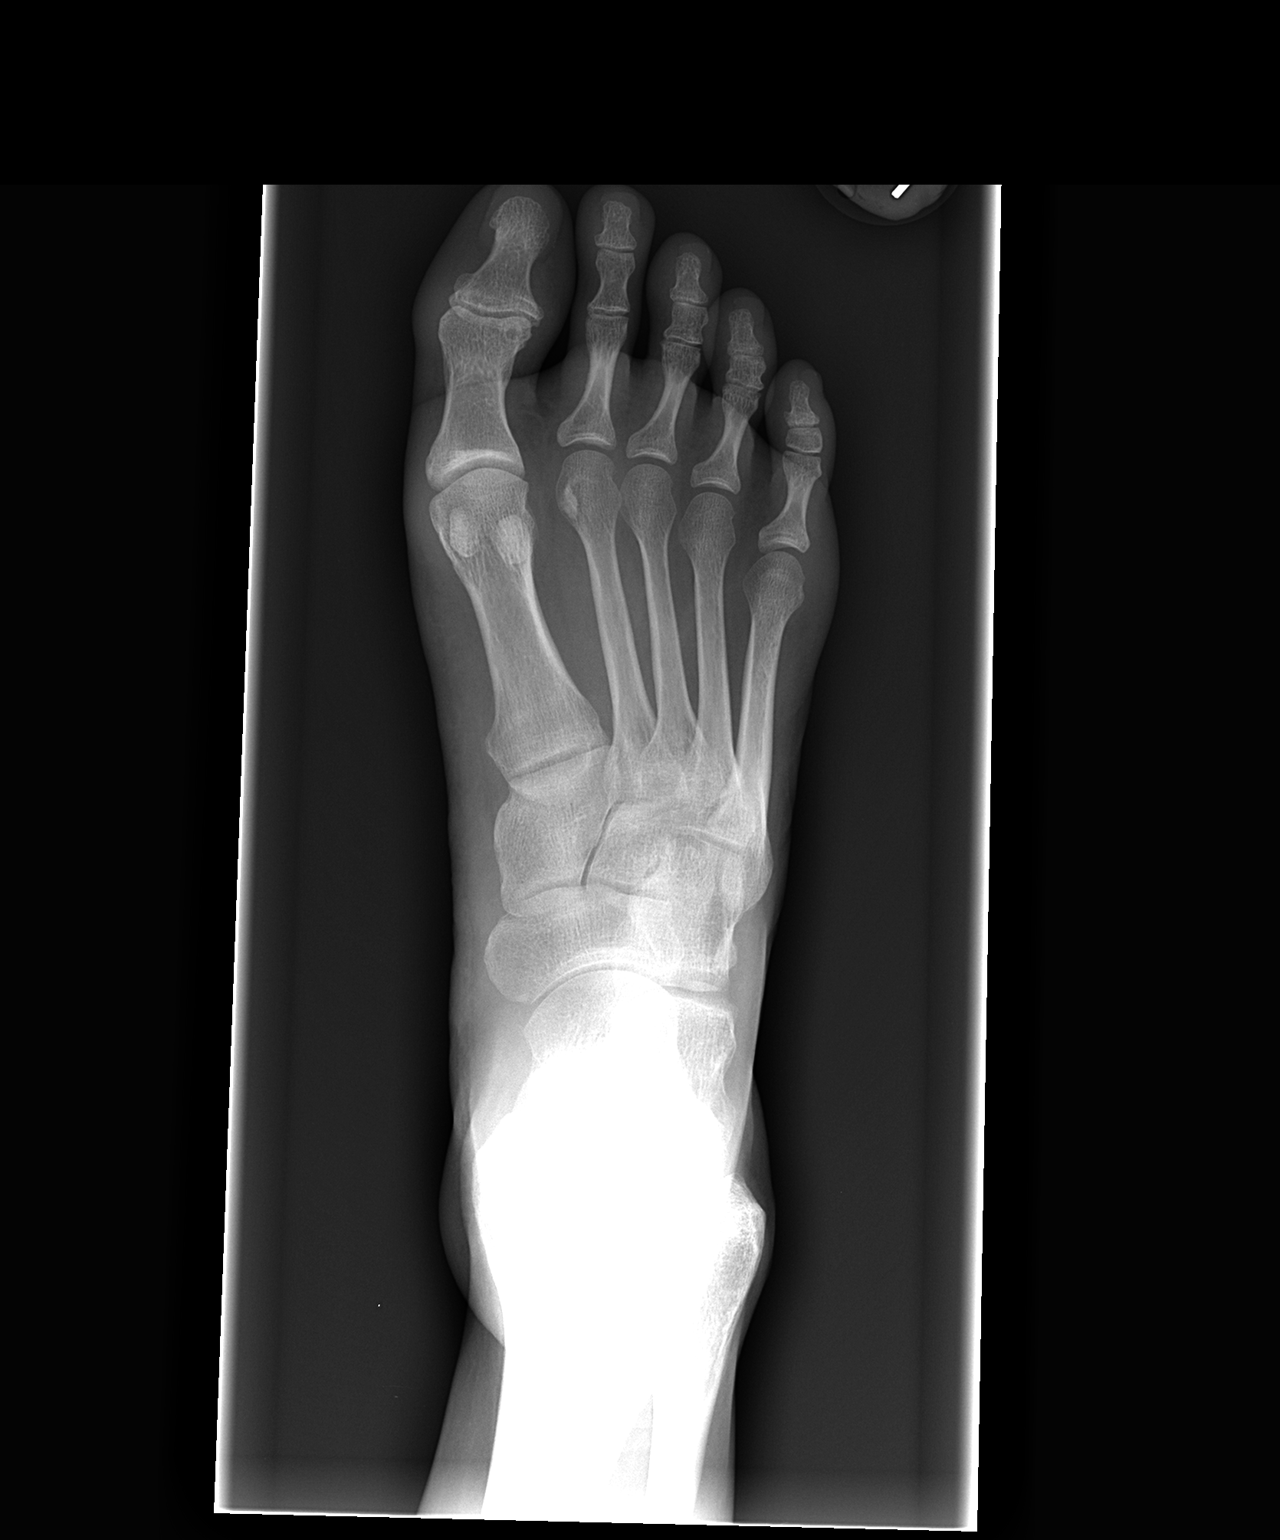

[view not recorded (2 of 3)]
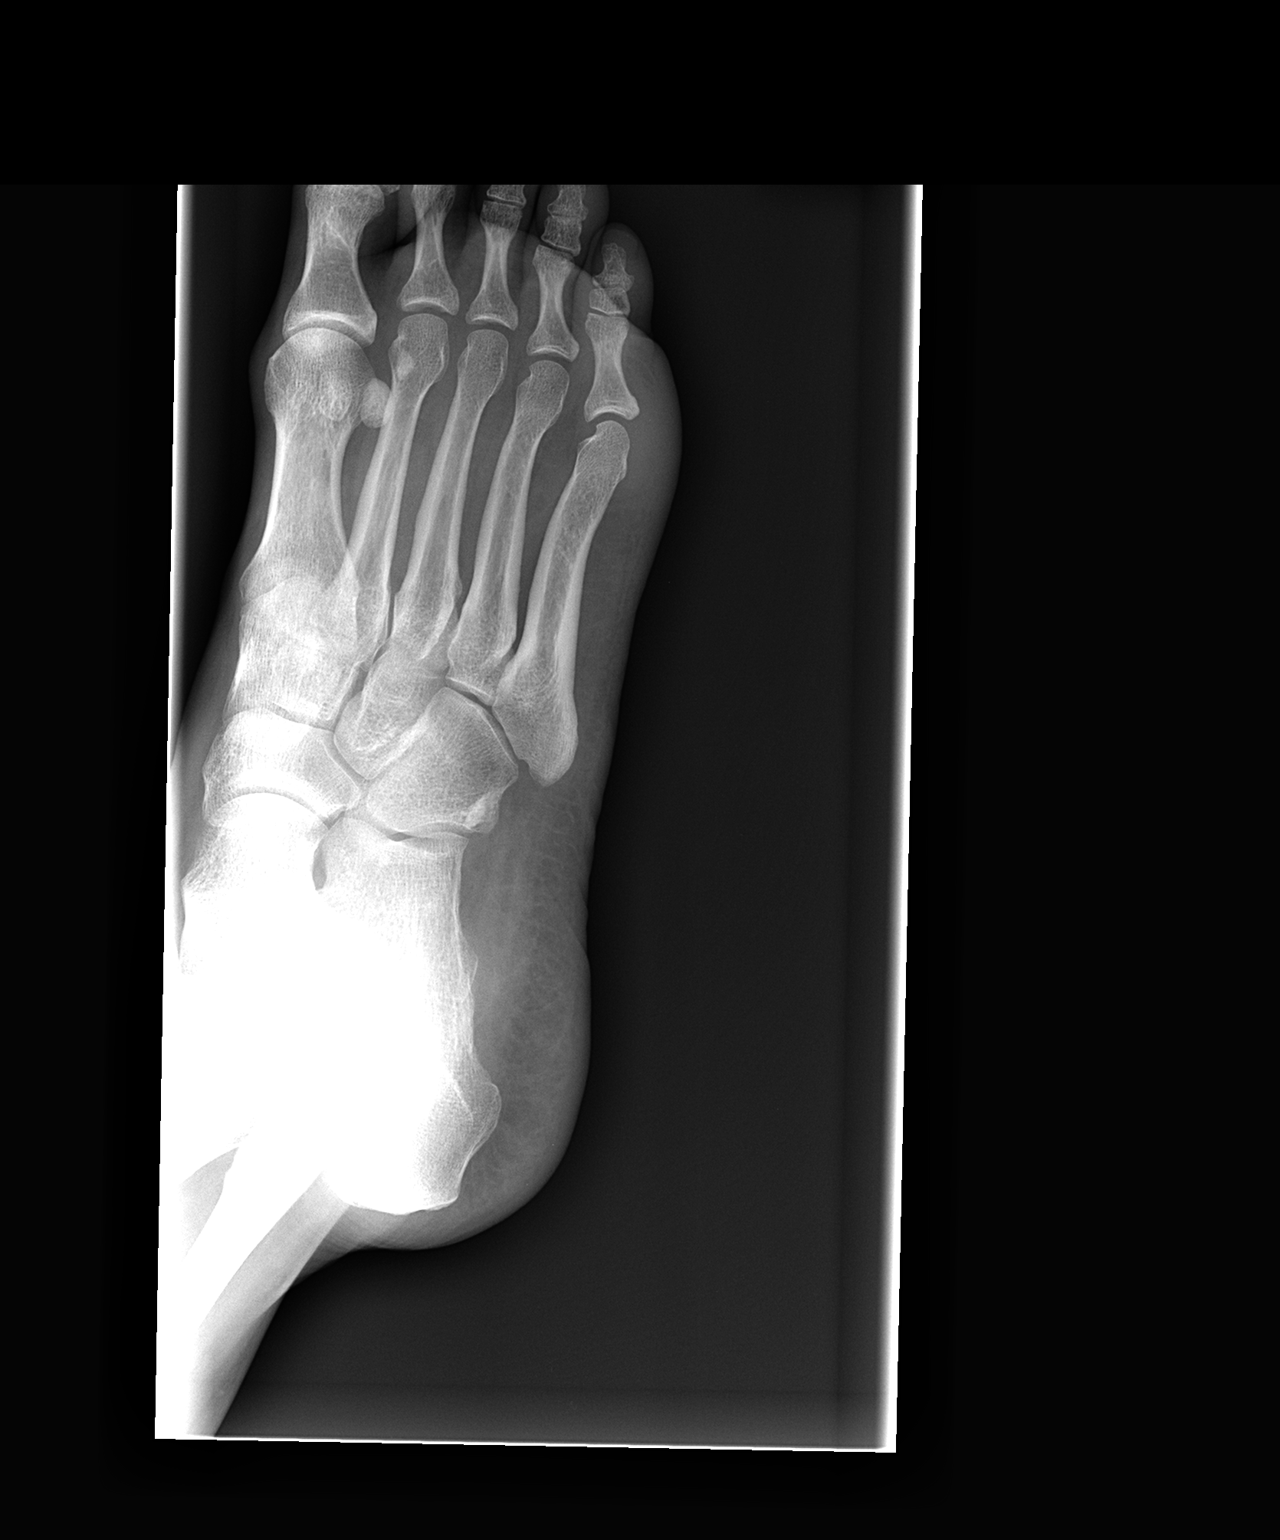

[view not recorded (3 of 3)]
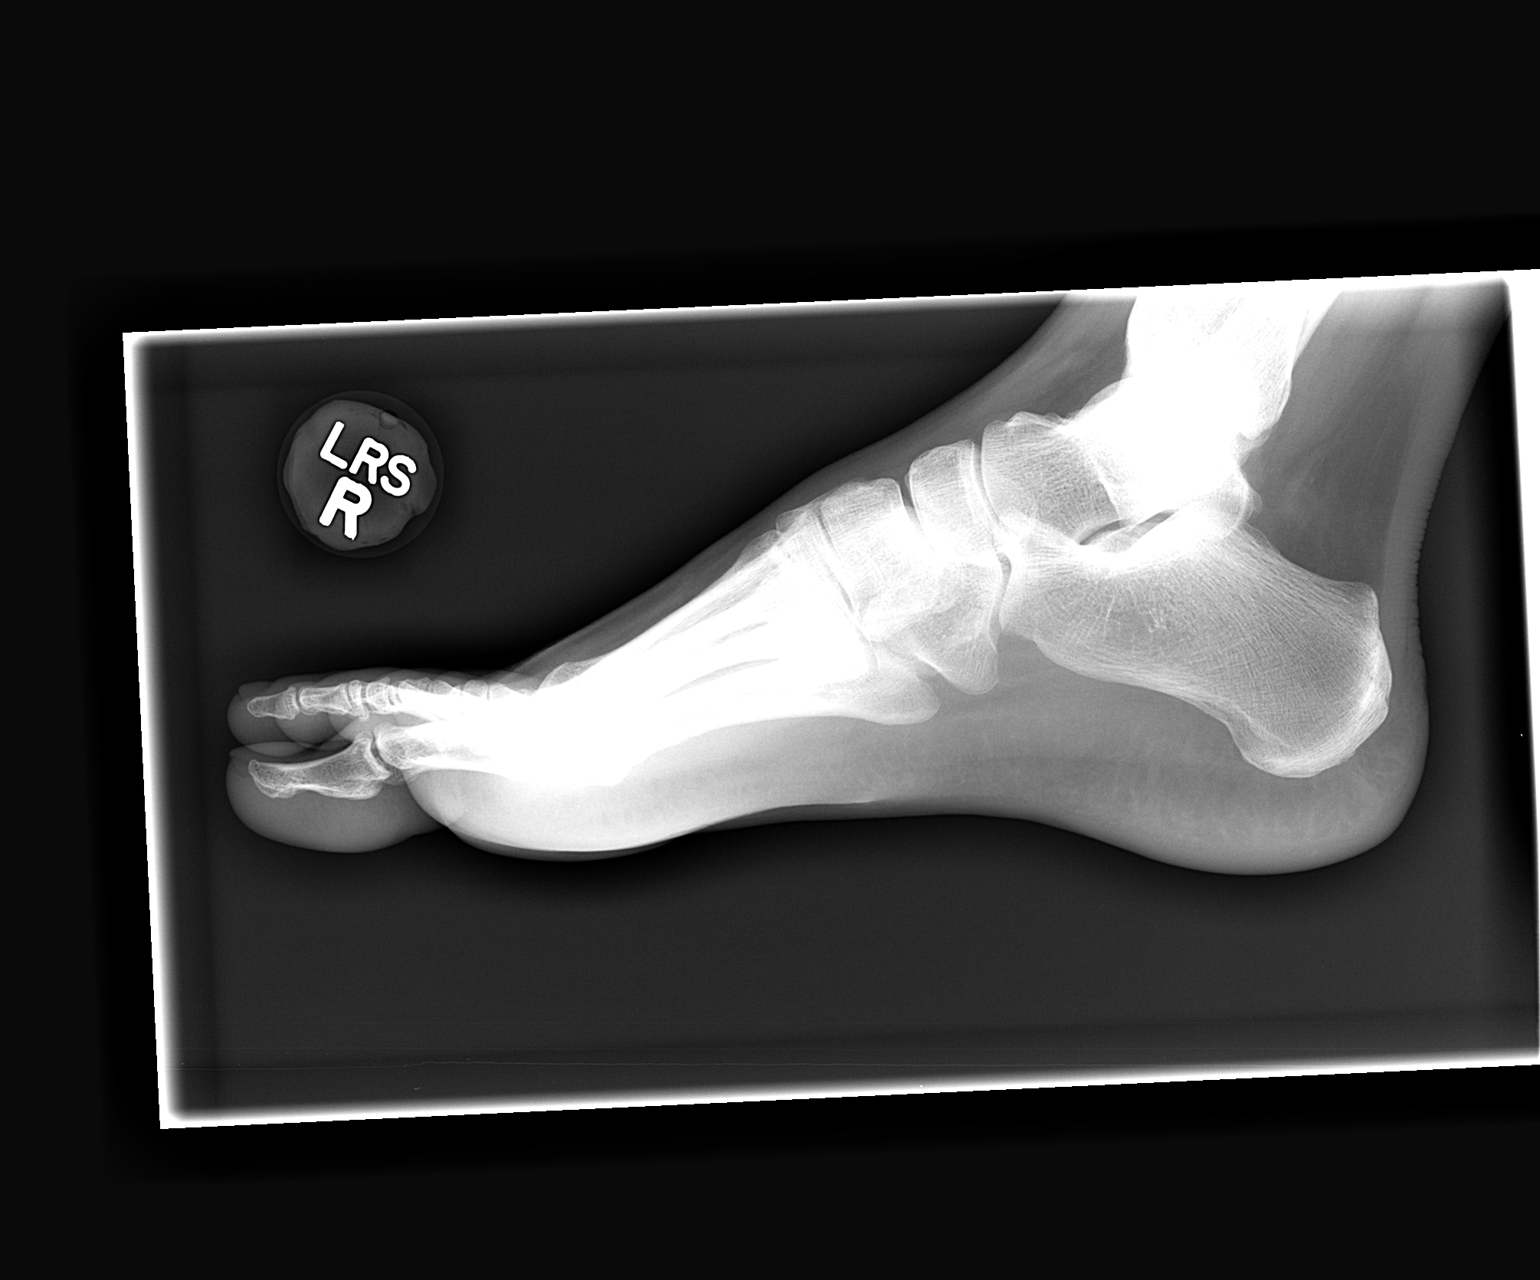

[3 of 3 positions shown; findings below may reference images not displayed]

FINDINGS: Negative for radiodense foreign body or subcutaneous gas.
Negative for fracture, dislocation, or other acute abnormality.
Normal alignment and mineralization. No significant degenerative
change.  Regional soft tissues unremarkable.
IMPRESSION: Negative

## 2012-01-18 ENCOUNTER — Emergency Department (HOSPITAL_COMMUNITY)
Admission: EM | Admit: 2012-01-18 | Discharge: 2012-01-18 | Disposition: A | Discharge status: Other Acute Inpatient Hospital | Payer: Self-pay | Attending: Emergency Medicine | Admitting: Emergency Medicine

## 2012-01-18 ENCOUNTER — Encounter (HOSPITAL_COMMUNITY): Payer: Self-pay

## 2012-01-18 ENCOUNTER — Inpatient Hospital Stay (HOSPITAL_COMMUNITY)
Admission: EM | Admit: 2012-01-18 | Discharge: 2012-01-20 | DRG: 897 | Disposition: A | Discharge status: Home / Self Care | Payer: 59 | Source: Ambulatory Visit | Attending: Psychiatry | Admitting: Psychiatry

## 2012-01-18 ENCOUNTER — Encounter (HOSPITAL_COMMUNITY): Payer: Self-pay | Admitting: *Deleted

## 2012-01-18 DIAGNOSIS — M549 Dorsalgia, unspecified: Secondary | ICD-10-CM | POA: Insufficient documentation

## 2012-01-18 DIAGNOSIS — F1110 Opioid abuse, uncomplicated: Secondary | ICD-10-CM

## 2012-01-18 DIAGNOSIS — G8929 Other chronic pain: Secondary | ICD-10-CM | POA: Diagnosis present

## 2012-01-18 DIAGNOSIS — F329 Major depressive disorder, single episode, unspecified: Secondary | ICD-10-CM | POA: Diagnosis present

## 2012-01-18 DIAGNOSIS — IMO0002 Reserved for concepts with insufficient information to code with codable children: Secondary | ICD-10-CM | POA: Diagnosis present

## 2012-01-18 DIAGNOSIS — F3289 Other specified depressive episodes: Secondary | ICD-10-CM | POA: Insufficient documentation

## 2012-01-18 DIAGNOSIS — Z8719 Personal history of other diseases of the digestive system: Secondary | ICD-10-CM | POA: Insufficient documentation

## 2012-01-18 DIAGNOSIS — Z8711 Personal history of peptic ulcer disease: Secondary | ICD-10-CM

## 2012-01-18 DIAGNOSIS — Z23 Encounter for immunization: Secondary | ICD-10-CM

## 2012-01-18 DIAGNOSIS — Z9851 Tubal ligation status: Secondary | ICD-10-CM | POA: Insufficient documentation

## 2012-01-18 DIAGNOSIS — F1120 Opioid dependence, uncomplicated: Principal | ICD-10-CM | POA: Diagnosis present

## 2012-01-18 LAB — CBC WITH DIFFERENTIAL/PLATELET
Eosinophils Absolute: 0.1 10*3/uL (ref 0.0–0.7)
Eosinophils Relative: 1 % (ref 0–5)
HCT: 39.1 % (ref 36.0–46.0)
Hemoglobin: 13.7 g/dL (ref 12.0–15.0)
Lymphs Abs: 2.4 10*3/uL (ref 0.7–4.0)
MCH: 36.1 pg — ABNORMAL HIGH (ref 26.0–34.0)
MCV: 102.9 fL — ABNORMAL HIGH (ref 78.0–100.0)
Monocytes Absolute: 0.7 10*3/uL (ref 0.1–1.0)
Monocytes Relative: 8 % (ref 3–12)
RBC: 3.8 MIL/uL — ABNORMAL LOW (ref 3.87–5.11)

## 2012-01-18 LAB — COMPREHENSIVE METABOLIC PANEL
BUN: 13 mg/dL (ref 6–23)
Calcium: 9.2 mg/dL (ref 8.4–10.5)
GFR calc Af Amer: >90 mL/min (ref >90)
GFR calc non Af Amer: 86 mL/min — ABNORMAL LOW (ref >90)
Glucose, Bld: 104 mg/dL — ABNORMAL HIGH (ref 70–99)
Total Protein: 7.4 g/dL (ref 6.0–8.3)

## 2012-01-18 LAB — URINE MICROSCOPIC-ADD ON

## 2012-01-18 LAB — URINALYSIS, ROUTINE W REFLEX MICROSCOPIC
Glucose, UA: NEGATIVE mg/dL
Leukocytes, UA: NEGATIVE
Specific Gravity, Urine: 1.025 (ref 1.005–1.030)
Urobilinogen, UA: 0.2 mg/dL (ref 0.0–1.0)

## 2012-01-18 LAB — ACETAMINOPHEN LEVEL
Acetaminophen (Tylenol), Serum: 16.2 ug/mL (ref 10–30)
Acetaminophen (Tylenol), Serum: <15 ug/mL (ref 10–30)

## 2012-01-18 LAB — RAPID URINE DRUG SCREEN, HOSP PERFORMED
Amphetamines: POSITIVE — AB
Benzodiazepines: POSITIVE — AB
Opiates: POSITIVE — AB

## 2012-01-18 MED ORDER — ACETAMINOPHEN 325 MG PO TABS: 650.0000 mg | ORAL_TABLET | Freq: Four times a day (QID) | ORAL | Status: DC | PRN

## 2012-01-18 MED ORDER — IBUPROFEN 800 MG PO TABS
800.0000 mg | ORAL_TABLET | Freq: Once | ORAL | Status: AC
  Administered 2012-01-18: 800 mg via ORAL
  Filled 2012-01-18: qty 1

## 2012-01-18 MED ORDER — NICOTINE 21 MG/24HR TD PT24
21.0000 mg | MEDICATED_PATCH | Freq: Every day | TRANSDERMAL | Status: DC
  Administered 2012-01-19: 21 mg via TRANSDERMAL
  Filled 2012-01-18 (×4): qty 1

## 2012-01-18 MED ORDER — LORAZEPAM 1 MG PO TABS
1.0000 mg | ORAL_TABLET | Freq: Once | ORAL | Status: AC
  Administered 2012-01-18: 1 mg via ORAL

## 2012-01-18 MED ORDER — CLONIDINE HCL 0.1 MG PO TABS: 0.1000 mg | ORAL_TABLET | Freq: Every day | ORAL | Status: DC

## 2012-01-18 MED ORDER — AMOXICILLIN 250 MG PO CAPS
500.0000 mg | ORAL_CAPSULE | Freq: Two times a day (BID) | ORAL | Status: DC
  Administered 2012-01-18: 500 mg via ORAL
  Filled 2012-01-18: qty 2

## 2012-01-18 MED ORDER — ONDANSETRON HCL 4 MG PO TABS: 4.0000 mg | ORAL_TABLET | Freq: Three times a day (TID) | ORAL | Status: DC | PRN

## 2012-01-18 MED ORDER — LORAZEPAM 1 MG PO TABS: 1.0000 mg | ORAL_TABLET | ORAL | Status: DC | PRN

## 2012-01-18 MED ORDER — DICYCLOMINE HCL 20 MG PO TABS: 20.0000 mg | ORAL_TABLET | Freq: Four times a day (QID) | ORAL | Status: DC | PRN

## 2012-01-18 MED ORDER — ONDANSETRON 4 MG PO TBDP: 4.0000 mg | ORAL_TABLET | Freq: Four times a day (QID) | ORAL | Status: DC | PRN

## 2012-01-18 MED ORDER — INFLUENZA VIRUS VACC SPLIT PF IM SUSP
0.5000 mL | INTRAMUSCULAR | Status: AC
  Administered 2012-01-19: 0.5 mL via INTRAMUSCULAR

## 2012-01-18 MED ORDER — NAPROXEN 500 MG PO TABS
500.0000 mg | ORAL_TABLET | Freq: Two times a day (BID) | ORAL | Status: DC | PRN
  Administered 2012-01-19: 500 mg via ORAL
  Filled 2012-01-18: qty 1

## 2012-01-18 MED ORDER — NICOTINE 21 MG/24HR TD PT24
21.0000 mg | MEDICATED_PATCH | Freq: Every day | TRANSDERMAL | Status: DC
  Administered 2012-01-18: 21 mg via TRANSDERMAL
  Filled 2012-01-18: qty 1

## 2012-01-18 MED ORDER — CLONIDINE HCL 0.1 MG PO TABS
0.1000 mg | ORAL_TABLET | ORAL | Status: DC
  Filled 2012-01-18 (×2): qty 1

## 2012-01-18 MED ORDER — HYDROXYZINE HCL 25 MG PO TABS
25.0000 mg | ORAL_TABLET | Freq: Four times a day (QID) | ORAL | Status: DC | PRN
  Administered 2012-01-18 – 2012-01-19 (×2): 25 mg via ORAL

## 2012-01-18 MED ORDER — METHOCARBAMOL 500 MG PO TABS: 500.0000 mg | ORAL_TABLET | Freq: Three times a day (TID) | ORAL | Status: DC | PRN

## 2012-01-18 MED ORDER — AMOXICILLIN 500 MG PO CAPS
500.0000 mg | ORAL_CAPSULE | Freq: Two times a day (BID) | ORAL | Status: DC
  Administered 2012-01-18 – 2012-01-20 (×4): 500 mg via ORAL
  Filled 2012-01-18 (×9): qty 1

## 2012-01-18 MED ORDER — MAGNESIUM HYDROXIDE 400 MG/5ML PO SUSP: 30.0000 mL | Freq: Every day | ORAL | Status: DC | PRN

## 2012-01-18 MED ORDER — CLONIDINE HCL 0.1 MG PO TABS
0.1000 mg | ORAL_TABLET | Freq: Four times a day (QID) | ORAL | Status: DC
  Administered 2012-01-18 – 2012-01-20 (×5): 0.1 mg via ORAL
  Filled 2012-01-18 (×10): qty 1

## 2012-01-18 MED ORDER — LOPERAMIDE HCL 2 MG PO CAPS: 2.0000 mg | ORAL_CAPSULE | ORAL | Status: DC | PRN

## 2012-01-18 MED ORDER — ALUM & MAG HYDROXIDE-SIMETH 200-200-20 MG/5ML PO SUSP: 30.0000 mL | ORAL | Status: DC | PRN

## 2012-01-18 MED ORDER — LORAZEPAM 1 MG PO TABS
1.0000 mg | ORAL_TABLET | ORAL | Status: DC | PRN
  Administered 2012-01-18: 1 mg via ORAL
  Filled 2012-01-18 (×3): qty 1

## 2012-01-18 NOTE — Tx Team (Signed)
Initial Interdisciplinary Treatment Plan  PATIENT STRENGTHS: (choose at least two) Ability for insight Average or above average intelligence  PATIENT STRESSORS: Health problems Substance abuse   PROBLEM LIST: Problem List/Patient Goals Date to be addressed Date deferred Reason deferred Estimated date of resolution  opoid abuse 01/18/12     depression 01/18/12                                                DISCHARGE CRITERIA:  Ability to meet basic life and health needs  PRELIMINARY DISCHARGE PLAN: Attend aftercare/continuing care group Attend PHP/IOP Attend 12-step recovery group Return to previous living arrangement  PATIENT/FAMIILY INVOLVEMENT: This treatment plan has been presented to and reviewed with the patient, Natalie Fleming, and/or family member,.  The patient and family have been given the opportunity to ask questions and make suggestions.  Natalie Fleming, Natalie Fleming 01/18/2012, 9:40 PM

## 2012-01-18 NOTE — ED Provider Notes (Signed)
History   This chart was scribed for Natalie Gaskins, MD by Gerlean Ren. This patient was seen in room [facility]16A/[facility]A and the patient's care was started at 12:04 PM .   CSN: [id number]  Arrival date & time 01/18/12  1123   First MD Initiated Contact with Patient 01/18/12 1134      Chief Complaint  Patient presents with  . V70.1    The history is provided by the patient. No language interpreter was used.   SARALEE Fleming is a 46 y.o. female who presents to the Emergency Department wanting to detox from percocet and Vicodin that she has been snorting for an unspecified amount of time.  Pt denies suicidal and homicidal thoughts.  Pt has h/o DDD.  Pt is a current everyday smoker but denies alcohol use.  She has no cp/sob.  No abd pain.  No weakness.  She reports nose pain due to snorting meds.  No double vision, no fever.  No severe headache is reported. Denies IVDA   Past Medical History  Diagnosis Date  . Peptic ulcer   . Degenerative disc disease   . Degenerative disc disease   . Degenerative disc disease   . Degenerative disc disease   . Degenerative disc disease   . Back pain, chronic     Past Surgical History  Procedure Date  . Tubal ligation     History reviewed. No pertinent family history.  History  Substance Use Topics  . Smoking status: Current Every Day Smoker -- 0.2 packs/day    Types: Cigarettes  . Smokeless tobacco: Not on file  . Alcohol Use: No    No OB history provided.  Review of Systems  Psychiatric/Behavioral: Negative for suicidal ideas.  All other systems reviewed and are negative.    Allergies  Review of patient's allergies indicates no known allergies.  Home Medications   Current Outpatient Rx  Name  Route  Sig  Dispense  Refill  . CITALOPRAM HYDROBROMIDE 20 MG PO TABS   Oral   Take 20 mg by mouth daily.         . IBUPROFEN 200 MG PO TABS   Oral   Take 400-800 mg by mouth every 6 (six) hours as needed. For pain         .  IBUPROFEN 800 MG PO TABS   Oral   Take 1 tablet (800 mg total) by mouth 3 (three) times daily.   21 tablet   0   . ICY HOT EX   Apply externally   Apply 1 application topically as needed. For pain in back         . TRAZODONE HCL 100 MG PO TABS   Oral   Take 100 mg by mouth at bedtime.           BP 123/79  Pulse 98  Temp 98.1 F (36.7 C) (Oral)  Resp 22  Ht 5\' 4"  (1.626 m)  Wt 112 lb (50.803 kg)  BMI 19.22 kg/m2  SpO2 100%  LMP 12/25/2011  Physical Exam CONSTITUTIONAL: Well developed/well nourished HEAD AND FACE: Normocephalic/atraumatic EYES: EOMI/PERRL ENMT: Mucous membranes moist NECK: supple no meningeal signs CV: S1/S2 noted, no murmurs/rubs/gallops noted LUNGS: Lungs are clear to auscultation bilaterally, no apparent distress ABDOMEN: soft, nontender, no rebound or guarding NEURO: Pt is awake/alert, moves all extremitiesx4 EXTREMITIES: pulses normal, full ROM SKIN: warm, color normal PSYCH: tearful, anxious  ED Course  Procedures  DIAGNOSTIC STUDIES: Oxygen Saturation is 100% on  room air, normal by my interpretation.    COORDINATION OF CARE: 12:07 PM-Patient informed of clinical course, understands medical decision-making process, and agrees with plan.  Discussed ACT team consult.  Ordered CBC, c-met, urinalysis, ethanol, acetaminophen, salicylate, PO ativan, PO zofran, nicotine patch, and ACT team consult.  3:07 PM Pt improved.  Seen by ACT and will arrange placement.   She admitted to taking percocet this morning.  Tylenol level noted.  Will recheck and if trending down will be safe for detox.  LFT panel normal and reassuring.  She reports she has not had any heavy drug use since yesterday.  I doubt toxic APAP overdose     MDM  Nursing notes including past medical history and social history reviewed and considered in documentation Labs/vital reviewed and considered    I personally performed the services described in this documentation, which  was scribed in my presence. The recorded information has been reviewed and considered.          Natalie Gaskins, MD 01/18/12 [phone]

## 2012-01-18 NOTE — ED Notes (Addendum)
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  Has been snorting these meds.  Tearful, eyes are red.  Pain rt side of face and dental pain.
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
## 2012-01-18 NOTE — Progress Notes (Signed)
Patient ID: Natalie Fleming, female   DOB: [birth year], 46 y.o.   MRN: [id number] D:Pt. In bed resting, reports snorting pills for years has messed up right side of her nose & face. No drainage, swelling or redness noted. A: Writer introduced self to pt. As nurse for this evening. Pt. Encouraged to come to window and get meds. Nurse reviewed meds with pt. Staff will monitor q30min for safety. R: Pt. Received meds without incidence. Pt. Is safe on the unit.

## 2012-01-18 NOTE — Progress Notes (Signed)
Psychoeducational Group Note  Date:  01/18/2012 Time:  2000  Group Topic/Focus:  Wrap-Up Group:   The focus of this group is to help patients review their daily goal of treatment and discuss progress on daily workbooks.  Participation Level:  Did Not Attend  Participation Quality:  N/A Did not attend  Affect:    Cognitive:    Insight:    Engagement in Group:    Additional Comments:  Patient was not able to attend wrap-up group tonight as patient was still enduring the admissions process at the time group was taking place.  Keno Caraway, Newton Pigg 01/18/2012, 10:48 PM

## 2012-01-18 NOTE — ED Notes (Signed)
[facility] called for transport to [facility]. Spoke with [employer]. A truck should be available soon,
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
## 2012-01-19 DIAGNOSIS — F1120 Opioid dependence, uncomplicated: Principal | ICD-10-CM

## 2012-01-19 DIAGNOSIS — F1910 Other psychoactive substance abuse, uncomplicated: Secondary | ICD-10-CM

## 2012-01-19 DIAGNOSIS — F1110 Opioid abuse, uncomplicated: Secondary | ICD-10-CM

## 2012-01-19 MED ORDER — ENSURE COMPLETE PO LIQD
237.0000 mL | Freq: Three times a day (TID) | ORAL | Status: DC
  Administered 2012-01-19: 237 mL via ORAL
  Administered 2012-01-19: 22:00:00 via ORAL
  Administered 2012-01-20: 237 mL via ORAL

## 2012-01-19 NOTE — Progress Notes (Signed)
Psychoeducational Group Note  Date:  01/19/2012 Time:  2000  Group Topic/Focus:  Karaoke   Participation Level:  Did not attend   Participation Quality:  Did not attend   Affect:  Did not attend   Cognitive:  Did not attend   Insight:  Did not attend   Engagement in Group:  Did not attend   Additional Comments:  Pt didn't attend group this evening.   Nardos Putnam A 01/19/2012, 11:30 PM
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
## 2012-01-19 NOTE — Progress Notes (Signed)
Patient ID: Natalie Fleming, female   DOB: May 29, 1965, 46 y.o.   MRN: [id number] D: pt. In bed reports feeling better, affect brighter. Pt. Denies pain. Pt. Reports anxious to find out if she will be going home tomorrow. Pt. Says finance' visited today. Pt. Reports her life are the grandchildren and they need her and she needs them. A: Staff will monitor q14min for safety. Pt. Encouraged to go to [employer]. Writer provided emotional support by listening and encouraging pt. To follow up with OP support and therapy. R: Pt. Is safe on the unit. Pt. Did not go to [employer]. Pt. Plans to follow up with [facility].

## 2012-01-19 NOTE — Progress Notes (Signed)
D: Patient cooperative with staff. Patient's mood depressed/sad; very tearful at times. She reported on self inventory sheet that her energy level is low, but ability to pay attention is good. Patient rated depression "5" and feelings of hopelessness "3". Patient verbalized that she wants to get better and leave pain medicine alone because she would like to be here for her grandchildren.  A: Support and encouragement provided to patient. Scheduled medications administered per MD orders. Monitor 15 minute observation for safety.  R: Patient receptive. Denies SI/HI/AVH. Patient remains safe.

## 2012-01-19 NOTE — Progress Notes (Signed)
Psychoeducational Group Note  Date:  01/19/2012 Time: 1100  Group Topic/Focus:  Building Self Esteem:   The Focus of this group is helping patients become aware of the effects of self-esteem on their lives, the things they and others do that enhance or undermine their self-esteem, seeing the relationship between their level of self-esteem and the choices they make and learning ways to enhance self-esteem.  Participation Level: Did Not Attend  Participation Quality:  Not Applicable  Affect:  Not Applicable  Cognitive:  Not Applicable  Insight:  Not Applicable  Engagement in Group: Not Applicable  Additional Comments:  Patient did not attend group.  [name] 01/19/2012, 7:05 PM

## 2012-01-19 NOTE — Progress Notes (Signed)
Psychoeducational Group Note  Date:  01/19/2012 Time:  1000  Group Topic/Focus:  Goals Group:   The focus of this group is to help patients establish daily goals to achieve during treatment and discuss how the patient can incorporate goal setting into their daily lives to aide in recovery.  Participation Level: Did Not Attend  Participation Quality:  Not Applicable  Affect:  Not Applicable  Cognitive:  Not Applicable  Insight:  Not Applicable  Engagement in Group: Not Applicable  Additional Comments:  Did not attend.   Noah Charon 01/19/2012, 11:34 AM
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
## 2012-01-19 NOTE — Treatment Plan (Signed)
Interdisciplinary Treatment Plan Update (Adult)  Date: 01/19/2012  Time Reviewed: 4:31 PM   Progress in Treatment: Attending groups: Yes Participating in groups: Yes Taking medication as prescribed: Yes Tolerating medication: Yes   Family/Significant other contact made:  No Patient understands diagnosis:  Yes  As evidenced by asking for help with detox from opiates Discussing patient identified problems/goals with staff:  Yes  See below Medical problems stabilized or resolved:  Yes Denies suicidal/homicidal ideation: Yes  In tx team Issues/concerns per patient self-inventory:  Yes  Poor sleep  Depression in 5  C/O headaches pain blurred vision  Rates pain at a 10 Other:  New problem(s) identified: N/A  Reason for Continuation of Hospitalization: Depression Medication stabilization Withdrawal symptoms  Interventions implemented related to continuation of hospitalization: Clonodine protocol  Naprosen for pain,  Amoxicillen for infection  Encourage group attendance and participation  Additional comments:  Estimated length of stay: 2-3 days  Discharge Plan: return home, follow up outpt  New goal(s): N/A  Review of initial/current patient goals per problem list:   1.  Goal(s): Stabilize mood  Met:  No  Target date:11/9  As evidenced NW:GNFAO will rate her depression at a 3 or less   2.  Goal (s):Safely detox from opiates  Met:  No  Target date:11/9  As evidenced by: Stable vitals, no withdrawal sumptoms  3.  Goal(s): Identify comprehensive sobriety plan  Met:  No  Target date:11/9  As evidenced ZH:YQMV report  4.  Goal(s):  Met:  Yes  Target date:  As evidenced by:  Attendees: Patient: Natalie Fleming  01/19/2012 4:31 PM  Family:     Physician:  Patrick Shaqueena Mauceri 01/19/2012 4:31 PM   Nursing:  Harold Barban  01/19/2012 4:31 PM   Clinical Social Worker:  Richelle Ito 01/19/2012 4:31 PM   Extender:   01/19/2012 4:31 PM   Other:     Other:     Other:     Other:       Scribe for Treatment Team:   Ida Rogue, 01/19/2012 4:31 PM

## 2012-01-20 DIAGNOSIS — F1920 Other psychoactive substance dependence, uncomplicated: Secondary | ICD-10-CM

## 2012-01-20 DIAGNOSIS — F329 Major depressive disorder, single episode, unspecified: Secondary | ICD-10-CM

## 2012-01-20 MED ORDER — AMOXICILLIN 500 MG PO CAPS: 500.0000 mg | ORAL_CAPSULE | Freq: Two times a day (BID) | ORAL | Status: DC

## 2012-01-20 NOTE — Discharge Summary (Signed)
Physician Discharge Summary Note  Patient:  Natalie Fleming is an 46 y.o., female MRN:  [id number] DOB:  [birth year] Patient phone:  [phone] (home)  Patient address:   [address] [place] [address]   Date of Admission:  01/18/2012 Date of Discharge:   Discharge Diagnoses: Principal Problem:  *Opiate abuse, continuous  Axis Diagnosis: AXIS I: Depressive Disorder NOS, Polysubstance Dependence  AXIS II: No diagnosis  AXIS III:  Past Medical History   Diagnosis  Date   .  Peptic ulcer    .  Degenerative disc disease    .  Degenerative disc disease    .  Degenerative disc disease    .  Degenerative disc disease    .  Degenerative disc disease    .  Back pain, chronic     AXIS IV: other psychosocial or environmental problems  AXIS V: 61-70 mild symptoms    Level of Care:  Inpatient Hospitalization.  Reason for admission: Natalie Fleming is a 46 year old white female who goes by the name [name]. She states she went to the [facility] ED in [place] to get help for some right sided facial pain she had been experiencing. She states she does not know why or how she ended up here at [facility]. She admits that she did have a history of snorting pain pills but was taking less than 1 a day, and had gone for several days without taking any. She denies other drugs. Luster Landsberg' felt she had done some damage to her nasal passage by snorting Percocet. She denies the need for detox as she reports that she last used 1 pill on Monday or Tuesday. She denies other drugs of abuse   Hospital Course:   The patient attended treatment team meeting this am and met with treatment team members. The patient's symptoms, treatment plan and response to treatment was discussed. The patient endorsed that their symptoms have improved. The patient also stated that they felt stable for discharge.  They reported that from this hospital stay they had learned many coping skills.  In other to maintain their psychiatric stability,  they will continue psychiatric care on an outpatient basis. They will follow-up as outlined below.  In addition they were instructed  to take all your medications as prescribed by their mental healthcare provider and to report any adverse effects and or reactions from your medicines to their outpatient provider promptly.  The patient is also instructed and cautioned to not engage in alcohol and or illegal drug use while on prescription medicines.  In the event of worsening symptoms the patient is instructed to call the crisis hotline, 911 and or go to the nearest ED for appropriate evaluation and treatment of symptoms.   Also while a patient in this hospital, the patient received medication management for his psychiatric symptoms. They were ordered and received as outlined below:    Medication List     As of 01/20/2012  1:31 PM    STOP taking these medications         ibuprofen 800 MG tablet   Commonly known as: ADVIL,MOTRIN      TAKE these medications      Indication    amoxicillin 500 MG capsule   Commonly known as: AMOXIL   Take 1 capsule (500 mg total) by mouth every 12 (twelve) hours.    Indication: Skin and Soft Tissue Infection       They were also enrolled in group counseling sessions  and activities in which they participated actively.       Follow-up Information    Follow up with [facility]. On 01/24/2012. (Your hospital follow up appointment is at 8AM on tues)    Contact information:   [address]  [place]  [phone]      Follow up with Attend AA/NA groups at [facility] house next to the police station in [place].         Upon discharge, patient adamantly denies suicidal, homicidal ideations, auditory, visual hallucinations and or delusional thinking. They left [facility] with all personal belongings via personal transportation in no apparent distress.  Consults:  Please see electronic medical record for details.  Significant Diagnostic Studies:  Please see electronic  medical record for details.  Discharge Vitals:   Blood pressure 108/74, pulse 109, temperature 97.5 F (36.4 C), temperature source Oral, resp. rate 20, height 5\' 1"  (1.549 m), weight 50.349 kg (111 lb), last menstrual period 12/25/2011..  Mental Status Exam: See Mental Status Examination and Suicide Risk Assessment completed by Attending Physician prior to discharge.  Discharge destination:  Home  Is patient on multiple antipsychotic therapies at discharge:  No  Has Patient had three or more failed trials of antipsychotic monotherapy by history: N/A Recommended Plan for Multiple Antipsychotic Therapies: N/A Discharge Orders    Future Orders Please Complete By Expires   Diet - low sodium heart healthy      Increase activity slowly          Medication List     As of 01/20/2012  1:31 PM    STOP taking these medications         ibuprofen 800 MG tablet   Commonly known as: ADVIL,MOTRIN      TAKE these medications      Indication    amoxicillin 500 MG capsule   Commonly known as: AMOXIL   Take 1 capsule (500 mg total) by mouth every 12 (twelve) hours.    Indication: Skin and Soft Tissue Infection           Follow-up Information    Follow up with [facility]. On 01/24/2012. (Your hospital follow up appointment is at 8AM on tues)    Contact information:   [address]  [place]  [phone]      Follow up with Attend AA/NA groups at [facility] house next to the police station in [place].        Follow-up recommendations:   Activities: Resume typical activities Diet: Resume typical diet Other: Follow up with outpatient provider and report any side effects to out patient prescriber.  Comments:  Take all your medications as prescribed by your mental healthcare provider. Report any adverse effects and or reactions from your medicines to your outpatient provider promptly. Patient is instructed and cautioned to not engage in alcohol and or illegal drug use while on  prescription medicines. In the event of worsening symptoms, patient is instructed to call the crisis hotline, 911 and or go to the nearest ED for appropriate evaluation and treatment of symptoms. Follow-up with your primary care provider for your other medical issues, concerns and or health care needs.  SignedPatrick North 01/20/2012 1:31 PM

## 2012-01-20 NOTE — [facility] Suicide Risk Assessment (Signed)
Suicide Risk Assessment  Discharge Assessment     Demographic Factors:  Female, caucasian  Mental Status Per Nursing Assessment::   On Admission:  NA  Current Mental Status by Physician: Patient alert and oriented to 4. States she was admitted to this unit by mistake. Denies AH/VH/SI/HI.  Loss Factors: NA  Historical Factors: Impulsivity  Risk Reduction Factors:   Living with another person, especially a relative and Positive social support  Continued Clinical Symptoms:  Alcohol/Substance Abuse/Dependencies  Cognitive Features That Contribute To Risk:  Thought constriction (tunnel vision)    Suicide Risk:  Minimal: No identifiable suicidal ideation.  Patients presenting with no risk factors but with morbid ruminations; may be classified as minimal risk based on the severity of the depressive symptoms  Discharge Diagnoses:   AXIS I:  Depressive Disorder NOS, Polysubstance Dependence AXIS II:  No diagnosis AXIS III:   Past Medical History  Diagnosis Date  . Peptic ulcer   . Degenerative disc disease   . Degenerative disc disease   . Degenerative disc disease   . Degenerative disc disease   . Degenerative disc disease   . Back pain, chronic    AXIS IV:  other psychosocial or environmental problems AXIS V:  61-70 mild symptoms  Plan Of Care/Follow-up recommendations:  Activity:  normal Diet:  normal Follow up with outpatient appointment.  Is patient on multiple antipsychotic therapies at discharge:  No   Has Patient had three or more failed trials of antipsychotic monotherapy by history:  No  Recommended Plan for Multiple Antipsychotic Therapies: NA  [facility] 01/20/2012, 9:47 AM

## 2012-01-20 NOTE — Progress Notes (Signed)
Discharge Note  D: Patient affect brighter today, as opposed to other previous shifts; her mood was appropriate to the circumstance. Patient verbalized that she was glad to be going home, so she could be with her grandchildren. Patient reported on self inventory sheet that her energy level is normal and ability to pay attention is good. She rated depression and feelings of hopelessness "0".  A: Support and encouragement provided. Administered scheduled medications per MD orders. Discharge instructions/prescriptions given to patient. Returned belongings to patient.  R: Patient receptive. Patient verbalized understanding of discharge instructions and prescriptions. Denies SI/HI/AVH. Patient d/c without incident.

## 2012-01-20 NOTE — Progress Notes (Signed)
Psychoeducational Group Note  Date:  01/20/2012 Time:  10:00AM  Group Topic/Focus:  Relapse Prevention Planning:   The focus of this group is to define relapse and discuss the need for planning to combat relapse.  Participation Level:  Did Not Attend   Nile Dear 01/20/2012, 11:27 AM

## 2012-01-25 NOTE — Progress Notes (Signed)
Patient Discharge Instructions:  After Visit Summary (AVS):   Faxed to:  01/25/12 Psychiatric Admission Assessment Note:   Faxed to:  01/25/12 Suicide Risk Assessment - Discharge Assessment:   Faxed to:  01/25/12 Faxed/Sent to the Next Level Care provider:  01/25/12 Faxed to [facility] @ [phone]  Jerelene Redden, 01/25/2012, 3:52 PM

## 2012-11-10 ENCOUNTER — Emergency Department (HOSPITAL_COMMUNITY)
Admission: EM | Admit: 2012-11-10 | Discharge: 2012-11-10 | Disposition: A | Discharge status: Home / Self Care | Payer: Disability Insurance | Attending: Emergency Medicine | Admitting: Emergency Medicine

## 2012-11-10 ENCOUNTER — Encounter (HOSPITAL_COMMUNITY): Payer: Self-pay | Admitting: *Deleted

## 2012-11-10 ENCOUNTER — Emergency Department (HOSPITAL_COMMUNITY): Payer: Self-pay

## 2012-11-10 DIAGNOSIS — R55 Syncope and collapse: Secondary | ICD-10-CM | POA: Insufficient documentation

## 2012-11-10 DIAGNOSIS — F17200 Nicotine dependence, unspecified, uncomplicated: Secondary | ICD-10-CM | POA: Insufficient documentation

## 2012-11-10 DIAGNOSIS — R079 Chest pain, unspecified: Secondary | ICD-10-CM

## 2012-11-10 DIAGNOSIS — R0789 Other chest pain: Secondary | ICD-10-CM | POA: Insufficient documentation

## 2012-11-10 DIAGNOSIS — Z8739 Personal history of other diseases of the musculoskeletal system and connective tissue: Secondary | ICD-10-CM | POA: Insufficient documentation

## 2012-11-10 DIAGNOSIS — R220 Localized swelling, mass and lump, head: Secondary | ICD-10-CM | POA: Insufficient documentation

## 2012-11-10 DIAGNOSIS — Z8719 Personal history of other diseases of the digestive system: Secondary | ICD-10-CM | POA: Insufficient documentation

## 2012-11-10 DIAGNOSIS — Z8659 Personal history of other mental and behavioral disorders: Secondary | ICD-10-CM | POA: Insufficient documentation

## 2012-11-10 HISTORY — DX: Bipolar disorder, unspecified: F31.9

## 2012-11-10 HISTORY — DX: Depression, unspecified: F32.A

## 2012-11-10 HISTORY — DX: Major depressive disorder, single episode, unspecified: F32.9

## 2012-11-10 LAB — CBC WITH DIFFERENTIAL/PLATELET
Basophils Relative: 0 % (ref 0–1)
Eosinophils Absolute: 0.1 10*3/uL (ref 0.0–0.7)
Eosinophils Relative: 1 % (ref 0–5)
Lymphs Abs: 2.1 10*3/uL (ref 0.7–4.0)
MCH: 35.2 pg — ABNORMAL HIGH (ref 26.0–34.0)
MCHC: 34.1 g/dL (ref 30.0–36.0)
MCV: 103.2 fL — ABNORMAL HIGH (ref 78.0–100.0)
Neutrophils Relative %: 70 % (ref 43–77)
Platelets: 222 10*3/uL (ref 150–400)
RBC: 4.03 MIL/uL (ref 3.87–5.11)
RDW: 12.8 % (ref 11.5–15.5)

## 2012-11-10 LAB — BASIC METABOLIC PANEL
Calcium: 9.3 mg/dL (ref 8.4–10.5)
GFR calc Af Amer: 88 mL/min — ABNORMAL LOW (ref >90)
GFR calc non Af Amer: 76 mL/min — ABNORMAL LOW (ref >90)
Glucose, Bld: 95 mg/dL (ref 70–99)
Potassium: 4.4 mEq/L (ref 3.5–5.1)
Sodium: 132 mEq/L — ABNORMAL LOW (ref 135–145)

## 2012-11-10 LAB — HEPATIC FUNCTION PANEL
ALT: 20 U/L (ref 0–35)
Alkaline Phosphatase: 68 U/L (ref 39–117)
Bilirubin, Direct: 0.1 mg/dL (ref 0.0–0.3)
Indirect Bilirubin: 0.4 mg/dL (ref 0.3–0.9)
Total Protein: 7.3 g/dL (ref 6.0–8.3)

## 2012-11-10 LAB — LIPASE, BLOOD: Lipase: 40 U/L (ref 11–59)

## 2012-11-10 MED ORDER — ONDANSETRON HCL 4 MG/2ML IJ SOLN
4.0000 mg | Freq: Once | INTRAMUSCULAR | Status: AC
  Administered 2012-11-10: 4 mg via INTRAVENOUS
  Filled 2012-11-10: qty 2

## 2012-11-10 MED ORDER — HYDROMORPHONE HCL PF 1 MG/ML IJ SOLN
1.0000 mg | Freq: Once | INTRAMUSCULAR | Status: AC
  Administered 2012-11-10: 1 mg via INTRAVENOUS
  Filled 2012-11-10: qty 1

## 2012-11-10 NOTE — ED Notes (Signed)
C/o right-sided rib/chest wall pain; reports sudden onset approx 30 min ago; states "I blacked out for a second", but denies falling and denies losing consciousness; reports she remembers all events PTA; also reports tongue feels swollen - no swelling noted; reports hx of anxiety and depression for which she takes 4 different medications. Pt reports severe (10/10) sharp right chest wall/rib pain, worse with movement; a&ox4; blunted affected; in no apparent distress.  Placed on cardiac monitor - NSR rate 82.

## 2012-11-10 NOTE — ED Notes (Signed)
Sudden onset of sharp R chest pain, radiating to back at 1030 this a.m.  "Blacked out"  But did not fall. Felt like tongue was swelling.  Spouse reports she was making chewing movement w/mouth. No hx of cardiac issues.

## 2012-11-10 NOTE — ED Provider Notes (Signed)
CSN: [id number]     Arrival date & time 11/10/12  1151 History  This chart was scribed for Natalie Lennert, MD by Quintella Reichert, ED scribe.  This patient was seen in room APA02/APA02 and the patient's care was started at 12:51 PM.    Chief Complaint  Patient presents with  . Chest Pain  . Oral Swelling    Patient is a 47 y.o. female presenting with chest pain. The history is provided by the patient. No language interpreter was used.  Chest Pain Pain location:  R chest Pain quality: sharp and shooting   Pain radiates to:  R arm Pain radiates to the back: yes   Pain severity:  Severe Onset quality:  Sudden Duration: 2 1/2 hours. Timing:  Constant Chronicity:  New Context comment:  Walking into her kitchen Associated symptoms: syncope ("blacked out")   Associated symptoms: no abdominal pain, no back pain, no cough, no fatigue and no headache   Associated symptoms comment:  "tongue feels swollen" Risk factors: smoking     HPI Comments: Natalie Fleming is a 47 y.o. female who presents to the Emergency Department complaining of sudden-onset, constant, sharp, right-sided chest pain radiating into right arm that began 2 1/2 hours ago.  Pt states she walked into her kitchen to get something to drink and suddenly developed sharp shooting pain from the center of her chest across the right side of her chest and into her right arm down to the hand.  Pain also radiated to her back.  She states that she "blacked out" for a second.  Since then she has had pain to the right chest and arm.  She also complains that her tongue feels swollen.  She denies prior h/o similar symptoms and has no known h/o cardiac issues.  She denies h/o abdominal surgery.  Pt has no PCP   Past Medical History  Diagnosis Date  . Peptic ulcer   . Degenerative disc disease   . Back pain, chronic   . Depression   . Bipolar 1 disorder     Past Surgical History  Procedure Laterality Date  . Tubal ligation      No  family history on file.   History  Substance Use Topics  . Smoking status: Current Every Day Smoker -- 0.25 packs/day    Types: Cigarettes  . Smokeless tobacco: Not on file  . Alcohol Use: No    OB History   Grav Para Term Preterm Abortions TAB SAB Ect Mult Living                   Review of Systems  Constitutional: Negative for appetite change and fatigue.  HENT: Negative for congestion, sinus pressure and ear discharge.        "tongue feels swollen"  Eyes: Negative for discharge.  Respiratory: Negative for cough.   Cardiovascular: Positive for chest pain and syncope ("blacked out"). Negative for leg swelling.  Gastrointestinal: Negative for abdominal pain and diarrhea.  Genitourinary: Negative for frequency and hematuria.  Musculoskeletal: Negative for back pain.  Skin: Negative for rash.  Neurological: Positive for syncope ("blacked out"). Negative for seizures and headaches.  Psychiatric/Behavioral: Negative for hallucinations.      Allergies  Review of patient's allergies indicates no known allergies.  Home Medications   Current Outpatient Rx  Name  Route  Sig  Dispense  Refill  . amoxicillin (AMOXIL) 500 MG capsule   Oral   Take 1 capsule (500 mg total) by  mouth every 12 (twelve) hours.   20 capsule   0    BP 128/89  Pulse 86  Temp(Src) 98.2 F (36.8 C) (Oral)  Resp 18  Ht 5\' 4"  (1.626 m)  Wt 130 lb (58.968 kg)  BMI 22.3 kg/m2  SpO2 97%  LMP 10/23/2012  Physical Exam  Nursing note and vitals reviewed. Constitutional: She is oriented to person, place, and time. She appears well-developed.  HENT:  Head: Normocephalic.  Eyes: Conjunctivae and EOM are normal. No scleral icterus.  Neck: Neck supple. No thyromegaly present.  Cardiovascular: Normal rate, regular rhythm and normal heart sounds.  Exam reveals no gallop and no friction rub.   No murmur heard. Pulmonary/Chest: Effort normal. No stridor. She has no wheezes. She has no rales. She exhibits  tenderness.  Mild tenderness over right lateral chest  Abdominal: She exhibits no distension. There is no tenderness. There is no rebound.  Musculoskeletal: Normal range of motion. She exhibits no edema.  Lymphadenopathy:    She has no cervical adenopathy.  Neurological: She is oriented to person, place, and time. Coordination normal.  Skin: No rash noted. No erythema.  Psychiatric: She has a normal mood and affect. Her behavior is normal.    ED Course  Procedures (including critical care time)  DIAGNOSTIC STUDIES: Oxygen Saturation is 97% on room air, normal by my interpretation.    COORDINATION OF CARE: 12:55 PM: Discussed treatment plan which includes pain medication and cardiac work-up.  Pt expressed understanding and agreed to plan.   Labs Review Labs Reviewed  CBC WITH DIFFERENTIAL - Abnormal; Notable for the following:    MCV 103.2 (*)    MCH 35.2 (*)    All other components within normal limits  BASIC METABOLIC PANEL - Abnormal; Notable for the following:    Sodium 132 (*)    GFR calc non Af Amer 76 (*)    GFR calc Af Amer 88 (*)    All other components within normal limits  TROPONIN I  LIPASE, BLOOD  HEPATIC FUNCTION PANEL    Imaging Review Dg Chest 2 View  11/10/2012   *RADIOLOGY REPORT*  Clinical Data: Chest pain  CHEST - 2 VIEW  Comparison:  07/19/2007  Findings:  The heart size and mediastinal contours are within normal limits.  Both lungs are clear.  The visualized skeletal structures are unremarkable.  IMPRESSION: No active cardiopulmonary disease.   Original Report Authenticated By: Judie Petit. Miles Costain, M.D.    Date: 11/10/2012  Rate:85  Rhythm: normal sinus rhythm  QRS Axis: normal  Intervals: normal  ST/T Wave abnormalities: normal  Conduction Disutrbances:none  Narrative Interpretation:   Old EKG Reviewed: none available   MDM  No diagnosis found.     The chart was scribed for me under my direct supervision.  I personally performed the history,  physical, and medical decision making and all procedures in the evaluation of this patient.Natalie Lennert, MD 11/10/12 [phone]

## 2013-04-19 ENCOUNTER — Emergency Department (HOSPITAL_COMMUNITY): Payer: Disability Insurance

## 2013-04-19 ENCOUNTER — Emergency Department (HOSPITAL_COMMUNITY)
Admission: EM | Admit: 2013-04-19 | Discharge: 2013-04-19 | Disposition: A | Discharge status: Home / Self Care | Payer: Disability Insurance | Attending: Emergency Medicine | Admitting: Emergency Medicine

## 2013-04-19 ENCOUNTER — Encounter (HOSPITAL_COMMUNITY): Payer: Self-pay | Admitting: Emergency Medicine

## 2013-04-19 DIAGNOSIS — R519 Headache, unspecified: Secondary | ICD-10-CM

## 2013-04-19 DIAGNOSIS — F29 Unspecified psychosis not due to a substance or known physiological condition: Secondary | ICD-10-CM | POA: Insufficient documentation

## 2013-04-19 DIAGNOSIS — Z8739 Personal history of other diseases of the musculoskeletal system and connective tissue: Secondary | ICD-10-CM | POA: Insufficient documentation

## 2013-04-19 DIAGNOSIS — R410 Disorientation, unspecified: Secondary | ICD-10-CM

## 2013-04-19 DIAGNOSIS — F319 Bipolar disorder, unspecified: Secondary | ICD-10-CM

## 2013-04-19 DIAGNOSIS — Z79899 Other long term (current) drug therapy: Secondary | ICD-10-CM | POA: Insufficient documentation

## 2013-04-19 DIAGNOSIS — Z8711 Personal history of peptic ulcer disease: Secondary | ICD-10-CM | POA: Insufficient documentation

## 2013-04-19 DIAGNOSIS — G8929 Other chronic pain: Secondary | ICD-10-CM | POA: Insufficient documentation

## 2013-04-19 DIAGNOSIS — Z791 Long term (current) use of non-steroidal anti-inflammatories (NSAID): Secondary | ICD-10-CM | POA: Insufficient documentation

## 2013-04-19 DIAGNOSIS — R51 Headache: Secondary | ICD-10-CM | POA: Insufficient documentation

## 2013-04-19 DIAGNOSIS — F17200 Nicotine dependence, unspecified, uncomplicated: Secondary | ICD-10-CM | POA: Insufficient documentation

## 2013-04-19 LAB — URINALYSIS, ROUTINE W REFLEX MICROSCOPIC
Bilirubin Urine: NEGATIVE
Glucose, UA: NEGATIVE mg/dL
Ketones, ur: NEGATIVE mg/dL
Leukocytes, UA: NEGATIVE
NITRITE: NEGATIVE
PROTEIN: NEGATIVE mg/dL
SPECIFIC GRAVITY, URINE: 1.01 (ref 1.005–1.030)
UROBILINOGEN UA: 0.2 mg/dL (ref 0.0–1.0)
pH: 7 (ref 5.0–8.0)

## 2013-04-19 LAB — COMPREHENSIVE METABOLIC PANEL
ALK PHOS: 79 U/L (ref 39–117)
ALT: 31 U/L (ref 0–35)
AST: 33 U/L (ref 0–37)
Albumin: 4.3 g/dL (ref 3.5–5.2)
BILIRUBIN TOTAL: 0.4 mg/dL (ref 0.3–1.2)
BUN: 10 mg/dL (ref 6–23)
CO2: 27 meq/L (ref 19–32)
Calcium: 9.8 mg/dL (ref 8.4–10.5)
Chloride: 98 mEq/L (ref 96–112)
Creatinine, Ser: 0.92 mg/dL (ref 0.50–1.10)
GFR, EST AFRICAN AMERICAN: 84 mL/min — AB (ref >90)
GFR, EST NON AFRICAN AMERICAN: 72 mL/min — AB (ref >90)
GLUCOSE: 102 mg/dL — AB (ref 70–99)
POTASSIUM: 4.2 meq/L (ref 3.7–5.3)
Sodium: 139 mEq/L (ref 137–147)
TOTAL PROTEIN: 8.4 g/dL — AB (ref 6.0–8.3)

## 2013-04-19 LAB — CBC WITH DIFFERENTIAL/PLATELET
BASOS ABS: 0 10*3/uL (ref 0.0–0.1)
BASOS PCT: 0 % (ref 0–1)
EOS ABS: 0.1 10*3/uL (ref 0.0–0.7)
Eosinophils Relative: 1 % (ref 0–5)
HCT: 46 % (ref 36.0–46.0)
HEMOGLOBIN: 15.8 g/dL — AB (ref 12.0–15.0)
Lymphocytes Relative: 26 % (ref 12–46)
Lymphs Abs: 2.3 10*3/uL (ref 0.7–4.0)
MCH: 35.3 pg — ABNORMAL HIGH (ref 26.0–34.0)
MCHC: 34.3 g/dL (ref 30.0–36.0)
MCV: 102.9 fL — ABNORMAL HIGH (ref 78.0–100.0)
Monocytes Absolute: 0.7 10*3/uL (ref 0.1–1.0)
Monocytes Relative: 8 % (ref 3–12)
NEUTROS ABS: 5.9 10*3/uL (ref 1.7–7.7)
NEUTROS PCT: 65 % (ref 43–77)
PLATELETS: 257 10*3/uL (ref 150–400)
RBC: 4.47 MIL/uL (ref 3.87–5.11)
RDW: 12.2 % (ref 11.5–15.5)
WBC: 9.1 10*3/uL (ref 4.0–10.5)

## 2013-04-19 LAB — RAPID URINE DRUG SCREEN, HOSP PERFORMED
Amphetamines: NOT DETECTED
Barbiturates: NOT DETECTED
Benzodiazepines: NOT DETECTED
Cocaine: NOT DETECTED
OPIATES: NOT DETECTED
TETRAHYDROCANNABINOL: POSITIVE — AB

## 2013-04-19 LAB — LIPASE, BLOOD: LIPASE: 97 U/L — AB (ref 11–59)

## 2013-04-19 LAB — TROPONIN I: Troponin I: <0.3 ng/mL (ref <0.30)

## 2013-04-19 LAB — URINE MICROSCOPIC-ADD ON

## 2013-04-19 LAB — ETHANOL

## 2013-04-19 MED ORDER — LORAZEPAM 2 MG/ML IJ SOLN
1.0000 mg | Freq: Once | INTRAMUSCULAR | Status: AC
  Administered 2013-04-19: 1 mg via INTRAVENOUS
  Filled 2013-04-19: qty 1

## 2013-04-19 MED ORDER — HYDROMORPHONE HCL PF 1 MG/ML IJ SOLN
1.0000 mg | Freq: Once | INTRAMUSCULAR | Status: AC
  Administered 2013-04-19: 1 mg via INTRAVENOUS
  Filled 2013-04-19: qty 1

## 2013-04-19 MED ORDER — NAPROXEN 500 MG PO TABS: 500.0000 mg | ORAL_TABLET | Freq: Two times a day (BID) | ORAL | Status: DC

## 2013-04-19 MED ORDER — ONDANSETRON HCL 4 MG/2ML IJ SOLN
4.0000 mg | Freq: Once | INTRAMUSCULAR | Status: AC
  Administered 2013-04-19: 4 mg via INTRAVENOUS
  Filled 2013-04-19: qty 2

## 2013-04-19 NOTE — ED Notes (Signed)
Pt alert & oriented x4, stable gait. Patient given discharge instructions, paperwork & prescription(s). Patient  instructed to stop at the registration desk to finish any additional paperwork. Patient verbalized understanding. Pt left department w/ no further questions. 

## 2013-04-19 NOTE — Discharge Instructions (Signed)
Return for any newer worse symptoms. Make a plan to followup with Dr. Delbert Harnesson Diego. Return for red blood staining all of the tall order read x2 today. Take Naprosyn as needed for the headaches. Continue take your regular medicines.

## 2013-04-19 NOTE — ED Provider Notes (Signed)
CSN: [id number]     Arrival date & time 04/19/13  1543 History  This chart was scribed for Shelda Jakes, MD by Leone Payor, ED Scribe. This patient was seen in room APA01/APA01 and the patient's care was started 4:53 PM.    Chief Complaint  Patient presents with  . Altered Mental Status    The history is provided by the spouse and the patient. No language interpreter was used.   Level 5 Caveat-Altered Mental Status HPI Comments: JEFFIE WIDDOWSON is a 48 y.o. female with past medical history of bipolar 1 disorder and depression who presents to the Emergency Department complaining of altered mental status. Per pt's significant other, she has been shaking and stating that her "brain was vibrating" about 2 hours ago. Significant other states pt was acting normally when he spoke to her at 1:30 pm today. He states she was not making any sense when he spoke with her an hour later. He states that patient has been followed by [facility] for the past 3 years. He reports she began taking Cymbalta 2 months ago but ran out and restarted 3 weeks ago. When asked, patient states she has a HA.     Past Medical History  Diagnosis Date  . Peptic ulcer   . Degenerative disc disease   . Degenerative disc disease   . Degenerative disc disease   . Degenerative disc disease   . Degenerative disc disease   . Back pain, chronic   . Depression   . Bipolar 1 disorder    Past Surgical History  Procedure Laterality Date  . Tubal ligation     History reviewed. No pertinent family history. History  Substance Use Topics  . Smoking status: Current Every Day Smoker -- 0.25 packs/day    Types: Cigarettes  . Smokeless tobacco: Not on file  . Alcohol Use: No   OB History   Grav Para Term Preterm Abortions TAB SAB Ect Mult Living                 Review of Systems  Unable to perform ROS: Mental status change    Allergies  Review of patient's allergies indicates no known allergies.  Home Medications    Current Outpatient Rx  Name  Route  Sig  Dispense  Refill  . busPIRone (BUSPAR) 10 MG tablet   Oral   Take 15 mg by mouth 3 (three) times daily.         . citalopram (CELEXA) 40 MG tablet   Oral   Take 40 mg by mouth daily.         . DULoxetine (CYMBALTA) 30 MG capsule   Oral   Take 30 mg by mouth daily.         . traZODone (DESYREL) 150 MG tablet   Oral   Take 300 mg by mouth at bedtime.         . naproxen (NAPROSYN) 500 MG tablet   Oral   Take 1 tablet (500 mg total) by mouth 2 (two) times daily.   14 tablet   0    BP 124/41  Pulse 91  Temp(Src) 98.1 F (36.7 C) (Oral)  Resp 12  Ht 5\' 6"  (1.676 m)  Wt 150 lb (68.04 kg)  BMI 24.22 kg/m2  SpO2 98% Physical Exam  Nursing note and vitals reviewed. Constitutional: She appears well-developed and well-nourished.  HENT:  Head: Normocephalic and atraumatic.  Eyes: EOM are normal.  Pupils are responsive  Cardiovascular: Normal rate, regular rhythm and normal heart sounds.   Blood pressure is 146/86  Pulmonary/Chest: Effort normal and breath sounds normal. No respiratory distress. She has no wheezes. She has no rales.  96% oxygen saturation on room air.   Abdominal: Soft. Bowel sounds are normal. She exhibits no distension. There is no tenderness.  Genitourinary: Rectum normal. Rectal exam shows anal tone normal.  Few skin tags but no prolapsed hemorrhoids. No gross blood present.   Musculoskeletal: She exhibits no edema.  Moving extremities well.   Neurological: She is alert.  Skin: Skin is warm and dry.  Psychiatric: She has a normal mood and affect.    ED Course  Procedures (including critical care time)  DIAGNOSTIC STUDIES: Oxygen Saturation is 97% on RA, adequate by my interpretation.    COORDINATION OF CARE: 4:56 PM Discussed treatment plan with pt at bedside and pt agreed to plan.  8:42 PM Pt reports having episodes of BM's with just blood for the past week. Will perform chaperoned rectal  exam at this time. Husband also states patient has returned to baseline. She denies SI currently.     Labs Review Labs Reviewed  COMPREHENSIVE METABOLIC PANEL - Abnormal; Notable for the following:    Glucose, Bld 102 (*)    Total Protein 8.4 (*)    GFR calc non Af Amer 72 (*)    GFR calc Af Amer 84 (*)    All other components within normal limits  LIPASE, BLOOD - Abnormal; Notable for the following:    Lipase 97 (*)    All other components within normal limits  CBC WITH DIFFERENTIAL - Abnormal; Notable for the following:    Hemoglobin 15.8 (*)    MCV 102.9 (*)    MCH 35.3 (*)    All other components within normal limits  URINALYSIS, ROUTINE W REFLEX MICROSCOPIC - Abnormal; Notable for the following:    Hgb urine dipstick TRACE (*)    All other components within normal limits  URINE RAPID DRUG SCREEN (HOSP PERFORMED) - Abnormal; Notable for the following:    Tetrahydrocannabinol POSITIVE (*)    All other components within normal limits  ETHANOL  TROPONIN I  URINE MICROSCOPIC-ADD ON   Results for orders placed during the hospital encounter of 04/19/13  COMPREHENSIVE METABOLIC PANEL      Result Value Range   Sodium 139  137 - 147 mEq/L   Potassium 4.2  3.7 - 5.3 mEq/L   Chloride 98  96 - 112 mEq/L   CO2 27  19 - 32 mEq/L   Glucose, Bld 102 (*) 70 - 99 mg/dL   BUN 10  6 - 23 mg/dL   Creatinine, Ser 1.610.92  0.50 - 1.10 mg/dL   Calcium 9.8  8.4 - 09.610.5 mg/dL   Total Protein 8.4 (*) 6.0 - 8.3 g/dL   Albumin 4.3  3.5 - 5.2 g/dL   AST 33  0 - 37 U/L   ALT 31  0 - 35 U/L   Alkaline Phosphatase 79  39 - 117 U/L   Total Bilirubin 0.4  0.3 - 1.2 mg/dL   GFR calc non Af Amer 72 (*) >90 mL/min   GFR calc Af Amer 84 (*) >90 mL/min  LIPASE, BLOOD      Result Value Range   Lipase 97 (*) 11 - 59 U/L  CBC WITH DIFFERENTIAL      Result Value Range   WBC 9.1  4.0 - 10.5 K/uL   RBC  4.47  3.87 - 5.11 MIL/uL   Hemoglobin 15.8 (*) 12.0 - 15.0 g/dL   HCT 16.1  09.6 - 04.5 %   MCV 102.9  (*) 78.0 - 100.0 fL   MCH 35.3 (*) 26.0 - 34.0 pg   MCHC 34.3  30.0 - 36.0 g/dL   RDW 40.9  81.1 - 91.4 %   Platelets 257  150 - 400 K/uL   Neutrophils Relative % 65  43 - 77 %   Neutro Abs 5.9  1.7 - 7.7 K/uL   Lymphocytes Relative 26  12 - 46 %   Lymphs Abs 2.3  0.7 - 4.0 K/uL   Monocytes Relative 8  3 - 12 %   Monocytes Absolute 0.7  0.1 - 1.0 K/uL   Eosinophils Relative 1  0 - 5 %   Eosinophils Absolute 0.1  0.0 - 0.7 K/uL   Basophils Relative 0  0 - 1 %   Basophils Absolute 0.0  0.0 - 0.1 K/uL  URINALYSIS, ROUTINE W REFLEX MICROSCOPIC      Result Value Range   Color, Urine YELLOW  YELLOW   APPearance CLEAR  CLEAR   Specific Gravity, Urine 1.010  1.005 - 1.030   pH 7.0  5.0 - 8.0   Glucose, UA NEGATIVE  NEGATIVE mg/dL   Hgb urine dipstick TRACE (*) NEGATIVE   Bilirubin Urine NEGATIVE  NEGATIVE   Ketones, ur NEGATIVE  NEGATIVE mg/dL   Protein, ur NEGATIVE  NEGATIVE mg/dL   Urobilinogen, UA 0.2  0.0 - 1.0 mg/dL   Nitrite NEGATIVE  NEGATIVE   Leukocytes, UA NEGATIVE  NEGATIVE  ETHANOL      Result Value Range   Alcohol, Ethyl (B) <11  0 - 11 mg/dL  URINE RAPID DRUG SCREEN (HOSP PERFORMED)      Result Value Range   Opiates NONE DETECTED  NONE DETECTED   Cocaine NONE DETECTED  NONE DETECTED   Benzodiazepines NONE DETECTED  NONE DETECTED   Amphetamines NONE DETECTED  NONE DETECTED   Tetrahydrocannabinol POSITIVE (*) NONE DETECTED   Barbiturates NONE DETECTED  NONE DETECTED  TROPONIN I      Result Value Range   Troponin I <0.30  <0.30 ng/mL  URINE MICROSCOPIC-ADD ON      Result Value Range   RBC / HPF 0-2  <3 RBC/hpf    Imaging Review Dg Chest 2 View  04/19/2013   CLINICAL DATA:  Altered mental status, weakness.  EXAM: CHEST  2 VIEW  COMPARISON:  11/10/2012  FINDINGS: The heart size and mediastinal contours are within normal limits. Both lungs are clear. The visualized skeletal structures are unremarkable.  IMPRESSION: No active cardiopulmonary disease.   Electronically  Signed   By: Charlett Nose M.D.   On: 04/19/2013 17:58   Ct Head Wo Contrast  04/19/2013   CLINICAL DATA:  Altered mental status  EXAM: CT HEAD WITHOUT CONTRAST  TECHNIQUE: Contiguous axial images were obtained from the base of the skull through the vertex without intravenous contrast.  COMPARISON:  11/15/2007  FINDINGS: The bony calvarium is intact. The ventricles are of normal size and configuration. No findings to suggest acute hemorrhage, acute infarction or space-occupying mass lesion are noted.  IMPRESSION: No acute abnormality noted.   Electronically Signed   By: Alcide Clever M.D.   On: 04/19/2013 17:59    EKG Interpretation    Date/Time:  Friday April 19 2013 17:34:52 EST Ventricular Rate:  76 PR Interval:  164 QRS Duration: 72 QT  Interval:  388 QTC Calculation: 436 R Axis:   45 Text Interpretation:  Normal sinus rhythm Low voltage QRS Septal infarct , age undetermined Abnormal ECG When compared with ECG of 10-Nov-2012 11:56, Septal infarct is now Present Nonspecific T wave abnormality now evident in Inferior leads Confirmed by Adamariz Gillott  MD, Creg Gilmer ([id number]) on 04/19/2013 5:39:18 PM            MDM   1. Headache   2. Confusion   3. Bipolar 1 disorder    The patient now back to baseline mental status as per her significant other. Suspect some sort of altered mental status confusion due to to overmedication. However cannot prove that. Patient's workup negative. Patient stated that she's had red blood in her bowel movements everyday for the past week however rectal exam has no gross blood stools brown. Hemoccult card is pending. Patient's hemoglobin and hematocrit is also normal. Patient had a mild elevation in her lipase but she does have no complaint of abdominal pain. Patient denies any suicidal or homicidal thoughts. Outpatient once to be discharged home. She is followed by day Loraine Leriche.     I personally performed the services described in this documentation, which was scribed in  my presence. The recorded information has been reviewed and is accurate.    Shelda Jakes, MD 04/19/13 2051

## 2013-04-19 NOTE — ED Notes (Signed)
Pt husband wife double buspar yesterday per MD. Pt complained of not being able to see out of right eye this morn 7am. Pt called him around 1:30pm pt complaining of head shaking, at 2;30 father called him, pt was shaking, altered mental. Pt is talking in third person. And see people in the room.

## 2013-04-19 NOTE — ED Notes (Signed)
Per husband text 1:20pm stated, I do not know what is wrong with me feel like I am having a nervous break down.  Father in the room now, pt talking inappropriate. States she just seen her grandmother, which father states has been dead for 10 years.  Pt continue to talk but nothing appropriate to our questions.

## 2013-04-19 NOTE — ED Notes (Signed)
Pt seems better, eye are not a dilated, pt not talking in third person. Husband agrees

## 2013-04-19 NOTE — ED Notes (Signed)
Patient admits to frequent marijuana use for pain and anxiety control.  Cautioned her about use with her current medication regimen which could cause adverse reactions.  States she has been using it for 30 years.

## 2013-04-19 NOTE — ED Notes (Signed)
Patient ambulatory to restroom with assistance, clean catch instructions given and advised pt to bring specimen back to room as well. 

## 2013-04-19 NOTE — ED Notes (Signed)
Patient relates HA is no better.  She has severe HA multiple days/week related to chronic back pain and DJD and states she is on no medications other than Tylenol.  Was told in the past not to take Goody Powders d/t small ulcerated area in gut.

## 2013-04-19 NOTE — ED Notes (Addendum)
Per significant other patient was c/o shaking and feeling like her "brain was vibrating." Significant other stated that se started Cymbalta 2 months ago ran out and restarted in 3 weeks. Ago. Patient c/o headache. Per significant other talked to patient again 2:30 and patient was not "making any sense." Per significant other patient has had irregular periods and has had rectal bleeding x1 week. Patient talking in third person and hallucinations her deceased grandmother.

## 2013-04-19 NOTE — ED Notes (Signed)
Per Husband pt has been having black stools for a week and yesterday was bright red. Husband stated she would not go to the doctor, because she said they would not do anything.

## 2013-06-21 IMAGING — CR DG LUMBAR SPINE COMPLETE 4+V
5 series · 5 of 5 positions shown · non-contrast
Comparison: 06/30/2008

CLINICAL DATA: Worsened low back pain

LUMBAR SPINE - COMPLETE 4+ VIEW

[view not recorded (1 of 5)]
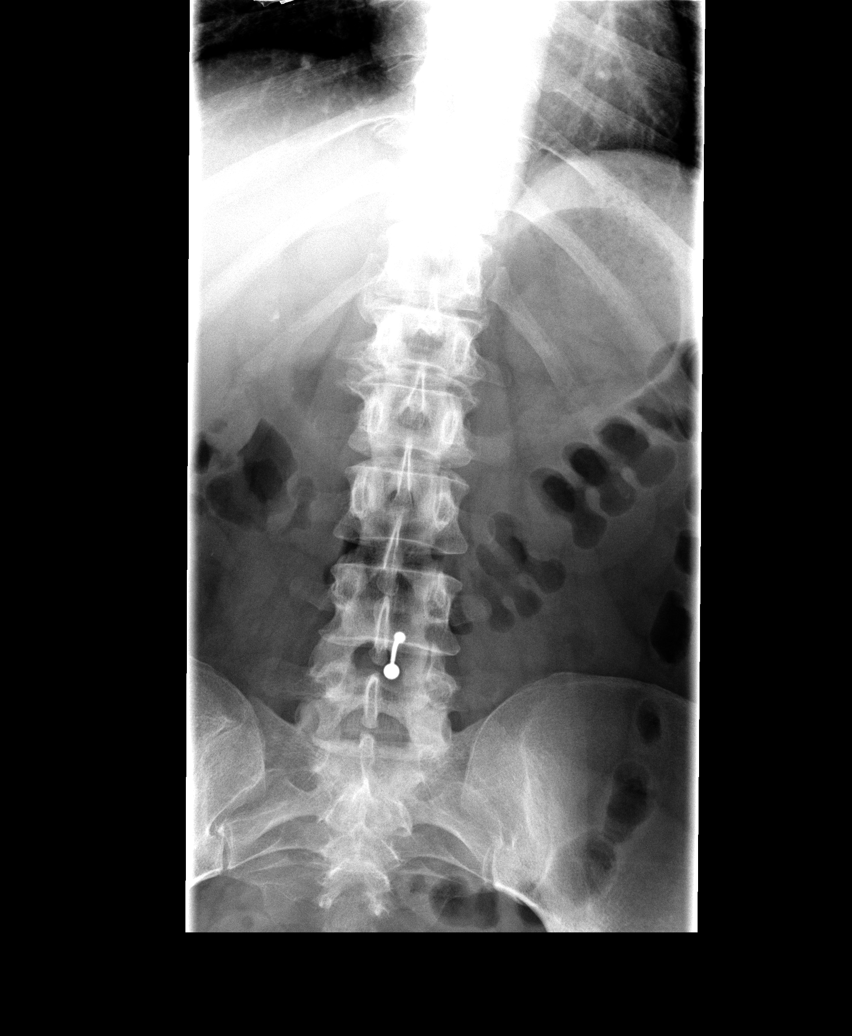

[view not recorded (2 of 5)]
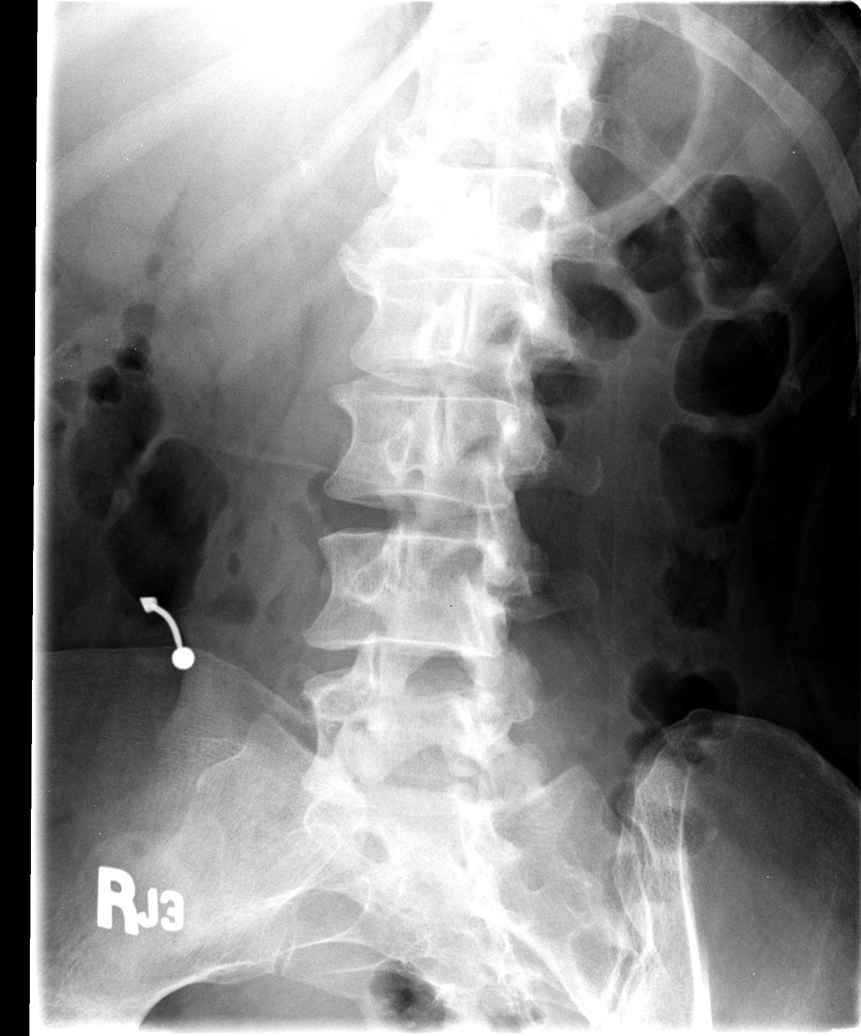

[view not recorded (3 of 5)]
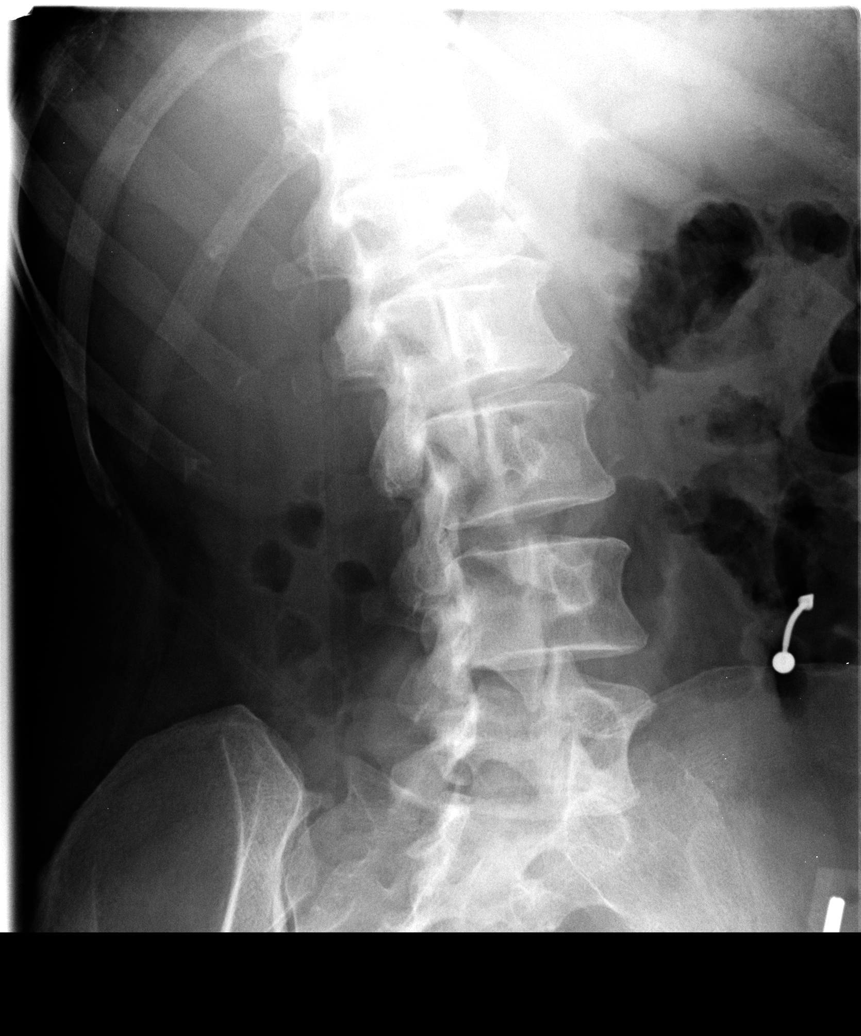

[view not recorded (4 of 5)]
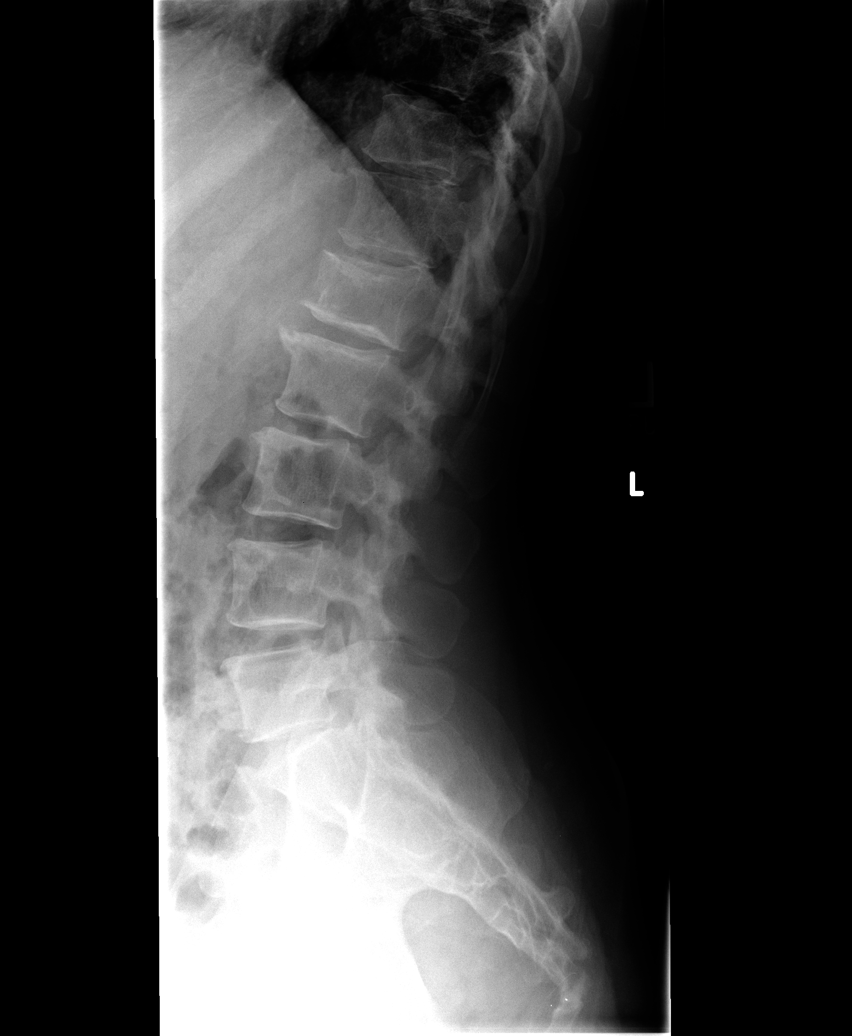

[view not recorded (5 of 5)]
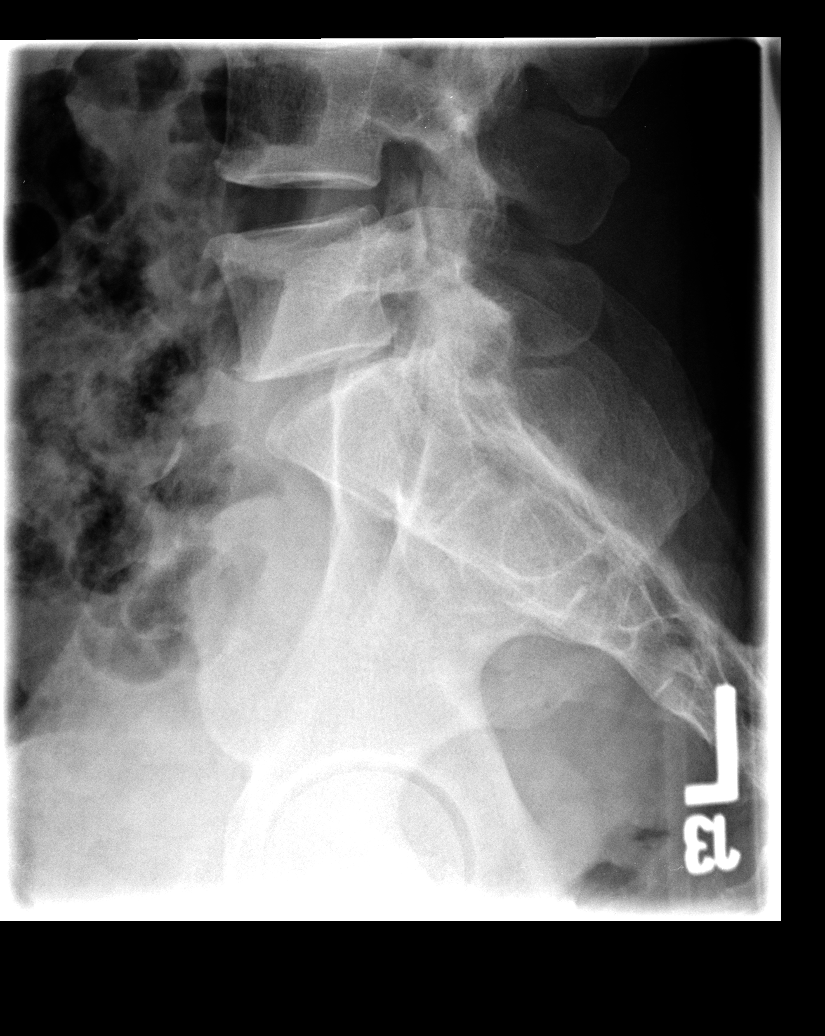

[5 of 5 positions shown; findings below may reference images not displayed]

FINDINGS: Five lumbar-type vertebral bodies show normal alignment.
There is an old superior endplate deformity at L1 which is not
changed. There is mild disc space narrowing at T12-L1, L1-2 and L2-
3 with small marginal osteophytes.  In the lower lumbar spine, disc
space heights are normal.  There is mild facet degeneration at L4-
5.
IMPRESSION: No acute or advanced disease.  Old superior endplate deformity at
L1.  Chronic disc degeneration at T12-L1, L1-2 and L2-3 with mild
disc space narrowing and endplate osteophytes.  Mild lower lumbar
facet degeneration.

## 2013-11-14 ENCOUNTER — Ambulatory Visit (HOSPITAL_COMMUNITY)
Admission: RE | Admit: 2013-11-14 | Discharge: 2013-11-14 | Disposition: A | Discharge status: Home / Self Care | Payer: Disability Insurance | Source: Ambulatory Visit | Attending: Family Medicine | Admitting: Family Medicine

## 2013-11-14 ENCOUNTER — Other Ambulatory Visit (HOSPITAL_COMMUNITY): Payer: Self-pay | Admitting: Family Medicine

## 2013-11-14 DIAGNOSIS — M545 Low back pain, unspecified: Secondary | ICD-10-CM

## 2013-11-14 DIAGNOSIS — M549 Dorsalgia, unspecified: Secondary | ICD-10-CM | POA: Insufficient documentation

## 2013-11-23 ENCOUNTER — Emergency Department (HOSPITAL_COMMUNITY)
Admission: EM | Admit: 2013-11-23 | Discharge: 2013-11-23 | Disposition: A | Discharge status: Home / Self Care | Payer: Disability Insurance | Attending: Emergency Medicine | Admitting: Emergency Medicine

## 2013-11-23 ENCOUNTER — Emergency Department (HOSPITAL_COMMUNITY): Payer: Disability Insurance

## 2013-11-23 ENCOUNTER — Encounter (HOSPITAL_COMMUNITY): Payer: Self-pay | Admitting: Emergency Medicine

## 2013-11-23 DIAGNOSIS — F319 Bipolar disorder, unspecified: Secondary | ICD-10-CM | POA: Insufficient documentation

## 2013-11-23 DIAGNOSIS — R1031 Right lower quadrant pain: Secondary | ICD-10-CM | POA: Insufficient documentation

## 2013-11-23 DIAGNOSIS — F17200 Nicotine dependence, unspecified, uncomplicated: Secondary | ICD-10-CM | POA: Insufficient documentation

## 2013-11-23 DIAGNOSIS — Z79899 Other long term (current) drug therapy: Secondary | ICD-10-CM | POA: Insufficient documentation

## 2013-11-23 DIAGNOSIS — Z8719 Personal history of other diseases of the digestive system: Secondary | ICD-10-CM | POA: Insufficient documentation

## 2013-11-23 DIAGNOSIS — Z3202 Encounter for pregnancy test, result negative: Secondary | ICD-10-CM | POA: Insufficient documentation

## 2013-11-23 DIAGNOSIS — N200 Calculus of kidney: Secondary | ICD-10-CM

## 2013-11-23 DIAGNOSIS — Z9851 Tubal ligation status: Secondary | ICD-10-CM | POA: Insufficient documentation

## 2013-11-23 LAB — CBC WITH DIFFERENTIAL/PLATELET
BASOS ABS: 0 10*3/uL (ref 0.0–0.1)
Basophils Relative: 0 % (ref 0–1)
EOS PCT: 0 % (ref 0–5)
Eosinophils Absolute: 0 10*3/uL (ref 0.0–0.7)
HCT: 43.7 % (ref 36.0–46.0)
Hemoglobin: 15.8 g/dL — ABNORMAL HIGH (ref 12.0–15.0)
LYMPHS PCT: 11 % — AB (ref 12–46)
Lymphs Abs: 1.7 10*3/uL (ref 0.7–4.0)
MCH: 35.7 pg — ABNORMAL HIGH (ref 26.0–34.0)
MCHC: 36.2 g/dL — ABNORMAL HIGH (ref 30.0–36.0)
MCV: 98.6 fL (ref 78.0–100.0)
Monocytes Absolute: 0.8 10*3/uL (ref 0.1–1.0)
Monocytes Relative: 5 % (ref 3–12)
NEUTROS PCT: 84 % — AB (ref 43–77)
Neutro Abs: 12.8 10*3/uL — ABNORMAL HIGH (ref 1.7–7.7)
PLATELETS: 269 10*3/uL (ref 150–400)
RBC: 4.43 MIL/uL (ref 3.87–5.11)
RDW: 11.7 % (ref 11.5–15.5)
WBC: 15.3 10*3/uL — AB (ref 4.0–10.5)

## 2013-11-23 LAB — COMPREHENSIVE METABOLIC PANEL
ALT: 18 U/L (ref 0–35)
ANION GAP: 17 — AB (ref 5–15)
AST: 17 U/L (ref 0–37)
Albumin: 4.5 g/dL (ref 3.5–5.2)
Alkaline Phosphatase: 96 U/L (ref 39–117)
BILIRUBIN TOTAL: 0.7 mg/dL (ref 0.3–1.2)
BUN: 15 mg/dL (ref 6–23)
CHLORIDE: 96 meq/L (ref 96–112)
CO2: 24 meq/L (ref 19–32)
CREATININE: 0.76 mg/dL (ref 0.50–1.10)
Calcium: 10.4 mg/dL (ref 8.4–10.5)
GLUCOSE: 153 mg/dL — AB (ref 70–99)
Potassium: 4 mEq/L (ref 3.7–5.3)
Sodium: 137 mEq/L (ref 137–147)
Total Protein: 8.7 g/dL — ABNORMAL HIGH (ref 6.0–8.3)

## 2013-11-23 LAB — URINALYSIS, ROUTINE W REFLEX MICROSCOPIC
Bilirubin Urine: NEGATIVE
Glucose, UA: NEGATIVE mg/dL
KETONES UR: NEGATIVE mg/dL
Leukocytes, UA: NEGATIVE
NITRITE: NEGATIVE
Specific Gravity, Urine: 1.015 (ref 1.005–1.030)
Urobilinogen, UA: 0.2 mg/dL (ref 0.0–1.0)
pH: 6.5 (ref 5.0–8.0)

## 2013-11-23 LAB — URINE MICROSCOPIC-ADD ON

## 2013-11-23 LAB — PREGNANCY, URINE: Preg Test, Ur: NEGATIVE

## 2013-11-23 LAB — LIPASE, BLOOD: Lipase: 37 U/L (ref 11–59)

## 2013-11-23 MED ORDER — SODIUM CHLORIDE 0.9 % IV BOLUS (SEPSIS)
500.0000 mL | Freq: Once | INTRAVENOUS | Status: AC
Start: 2013-11-23 — End: 2013-11-23
  Administered 2013-11-23: 500 mL via INTRAVENOUS

## 2013-11-23 MED ORDER — OXYCODONE-ACETAMINOPHEN 5-325 MG PO TABS: 2.0000 | ORAL_TABLET | ORAL | Status: DC | PRN

## 2013-11-23 MED ORDER — ONDANSETRON HCL 4 MG/2ML IJ SOLN
4.0000 mg | Freq: Once | INTRAMUSCULAR | Status: AC
  Administered 2013-11-23: 4 mg via INTRAVENOUS
  Filled 2013-11-23: qty 2

## 2013-11-23 MED ORDER — TAMSULOSIN HCL 0.4 MG PO CAPS: 0.4000 mg | ORAL_CAPSULE | Freq: Every day | ORAL | Status: DC

## 2013-11-23 MED ORDER — MORPHINE SULFATE 4 MG/ML IJ SOLN
4.0000 mg | Freq: Once | INTRAMUSCULAR | Status: AC
  Administered 2013-11-23: 4 mg via INTRAVENOUS
  Filled 2013-11-23: qty 1

## 2013-11-23 MED ORDER — KETOROLAC TROMETHAMINE 30 MG/ML IJ SOLN
30.0000 mg | Freq: Once | INTRAMUSCULAR | Status: AC
  Administered 2013-11-23: 30 mg via INTRAVENOUS
  Filled 2013-11-23: qty 1

## 2013-11-23 MED ORDER — IOHEXOL 300 MG/ML  SOLN
100.0000 mL | Freq: Once | INTRAMUSCULAR | Status: AC | PRN
  Administered 2013-11-23: 100 mL via INTRAVENOUS

## 2013-11-23 MED ORDER — IOHEXOL 300 MG/ML  SOLN
25.0000 mL | Freq: Once | INTRAMUSCULAR | Status: AC | PRN
  Administered 2013-11-23: 25 mL via ORAL

## 2013-11-23 MED ORDER — PROMETHAZINE HCL 25 MG PO TABS: 25.0000 mg | ORAL_TABLET | Freq: Four times a day (QID) | ORAL | Status: DC | PRN

## 2013-11-23 NOTE — Discharge Instructions (Signed)
Kidney Stones Kidney stones (urolithiasis) are solid masses that form inside your kidneys. The intense pain is caused by the stone moving through the kidney, ureter, bladder, and urethra (urinary tract). When the stone moves, the ureter starts to spasm around the stone. The stone is usually passed in your pee (urine).  HOME CARE  Drink enough fluids to keep your pee clear or pale yellow. This helps to get the stone out.  Strain all pee through the provided strainer. Do not pee without peeing through the strainer, not even once. If you pee the stone out, catch it in the strainer. The stone may be as small as a grain of salt. Take this to your doctor. This will help your doctor figure out what you can do to try to prevent more kidney stones.  Only take medicine as told by your doctor.  Follow up with your doctor as told.  Get follow-up X-rays as told by your doctor. GET HELP IF: You have pain that gets worse even if you have been taking pain medicine. GET HELP RIGHT AWAY IF:   Your pain does not get better with medicine.  You have a fever or shaking chills.  Your pain increases and gets worse over 18 hours.  You have new belly (abdominal) pain.  You feel faint or pass out.  You are unable to pee. MAKE SURE YOU:   Understand these instructions.  Will watch your condition.  Will get help right away if you are not doing well or get worse. Document Released: 08/17/2007 Document Revised: 10/31/2012 Document Reviewed: 08/01/2012 [facility] Patient Information 2015 [place], [place]. This information is not intended to replace advice given to you by your health care provider. Make sure you discuss any questions you have with your health care provider.   You have 8 mm kidney stone on the right. Medication for pain, nausea, and to increase urinary flow. Followup with urologist. Phone number given. If pain intensifies over the weekends, followup at [facility].

## 2013-11-23 NOTE — ED Notes (Signed)
Informed by CT that patient complained of pain at back of neck and head as soon as contrast was started.Patient assessed and EDP inofrmed.

## 2013-11-23 NOTE — ED Notes (Signed)
RLQ pain starting yesterday with nausea.

## 2013-11-23 NOTE — ED Provider Notes (Signed)
CSN: [id number]     Arrival date & time 11/23/13  1914 History   First MD Initiated Contact with Patient 11/23/13 0713   This chart was scribed for Donnetta Hutching, MD by Freida Busman, ED Scribe. This patient was seen in room APA06/APA06 and the patient's care was started 7:15 AM.    Chief Complaint  Patient presents with  . Abdominal Pain     The history is provided by the patient.   HPI Comments:  Natalie Fleming is a 48 y.o. female who presents to the Emergency Department complaining of moderate RLQ abdominal pain that started 2 days ago.Pt notes symptoms were preceded by an episode of mild spotting, states she has not had her period in 2 years and that the episode resolved on its own. Pt  reports associated nausea, diaphoresis last night as well as frequent urination last night with small urinary output. Pt denies vomiting, chills and vaginal discharge. Pt also reports h/o kidney stones and denies h/o appendectomy. No alleviating factors noted.    Past Medical History  Diagnosis Date  . Peptic ulcer   . Degenerative disc disease   . Degenerative disc disease   . Degenerative disc disease   . Degenerative disc disease   . Degenerative disc disease   . Back pain, chronic   . Depression   . Bipolar 1 disorder    Past Surgical History  Procedure Laterality Date  . Tubal ligation     No family history on file. History  Substance Use Topics  . Smoking status: Current Every Day Smoker -- 0.25 packs/day    Types: Cigarettes  . Smokeless tobacco: Not on file  . Alcohol Use: No   OB History   Grav Para Term Preterm Abortions TAB SAB Ect Mult Living                 Review of Systems  All other systems reviewed and are negative.  10 systems reviewed and negative other than pertinent ROS in HPI    Allergies  Review of patient's allergies indicates no known allergies.  Home Medications   Prior to Admission medications   Medication Sig Start Date End Date Taking? Authorizing  Provider  busPIRone (BUSPAR) 10 MG tablet Take 10 mg by mouth 3 (three) times daily.    Yes Historical Provider, MD  FLUoxetine (PROZAC) 20 MG capsule Take 20 mg by mouth 2 (two) times daily.    Yes Historical Provider, MD  ibuprofen (ADVIL,MOTRIN) 200 MG tablet Take 400 mg by mouth daily as needed for headache.   Yes Historical Provider, MD  traZODone (DESYREL) 150 MG tablet Take 300 mg by mouth at bedtime as needed for sleep.    Yes Historical Provider, MD  oxyCODONE-acetaminophen (PERCOCET) 5-325 MG per tablet Take 2 tablets by mouth every 4 (four) hours as needed. 11/23/13   Donnetta Hutching, MD  promethazine (PHENERGAN) 25 MG tablet Take 1 tablet (25 mg total) by mouth every 6 (six) hours as needed. 11/23/13   Donnetta Hutching, MD  tamsulosin (FLOMAX) 0.4 MG CAPS capsule Take 1 capsule (0.4 mg total) by mouth daily. 11/23/13   Donnetta Hutching, MD   BP 162/83  Pulse 70  Temp(Src) 97.8 F (36.6 C) (Oral)  Resp 18  SpO2 98% Physical Exam  Nursing note and vitals reviewed. Constitutional: She is oriented to person, place, and time. She appears well-developed and well-nourished.  HENT:  Head: Normocephalic and atraumatic.  Eyes: Conjunctivae and EOM are normal. Pupils are  equal, round, and reactive to light.  Neck: Normal range of motion. Neck supple.  Cardiovascular: Normal rate, regular rhythm and normal heart sounds.   Pulmonary/Chest: Effort normal and breath sounds normal.  Abdominal: There is tenderness.  Minimal RLQ tenderness   Musculoskeletal: Normal range of motion.  Neurological: She is alert and oriented to person, place, and time.  Skin: Skin is warm and dry.  Psychiatric: She has a normal mood and affect. Her behavior is normal.    ED Course  Procedures (including critical care time)   DIAGNOSTIC STUDIES:  Oxygen Saturation is 97% on RA, normal by my interpretation.    COORDINATION OF CARE:  7:16 AM Discussed plan to order CT to r/o appendicitis with pt at bedside and pt agreed to  plan.  Labs Review Labs Reviewed  COMPREHENSIVE METABOLIC PANEL - Abnormal; Notable for the following:    Glucose, Bld 153 (*)    Total Protein 8.7 (*)    Anion gap 17 (*)    All other components within normal limits  CBC WITH DIFFERENTIAL - Abnormal; Notable for the following:    WBC 15.3 (*)    Hemoglobin 15.8 (*)    MCH 35.7 (*)    MCHC 36.2 (*)    Neutrophils Relative % 84 (*)    Neutro Abs 12.8 (*)    Lymphocytes Relative 11 (*)    All other components within normal limits  URINALYSIS, ROUTINE W REFLEX MICROSCOPIC - Abnormal; Notable for the following:    Color, Urine ORANGE (*)    APPearance CLOUDY (*)    Hgb urine dipstick MODERATE (*)    Protein, ur TRACE (*)    All other components within normal limits  URINE MICROSCOPIC-ADD ON - Abnormal; Notable for the following:    Squamous Epithelial / LPF FEW (*)    Bacteria, UA MANY (*)    Crystals CA OXALATE CRYSTALS (*)    All other components within normal limits  PREGNANCY, URINE  LIPASE, BLOOD    Imaging Review Ct Abdomen Pelvis W Contrast  11/23/2013   CLINICAL DATA:  Right lower quadrant pain for 1 day with nausea  EXAM: CT ABDOMEN AND PELVIS WITH CONTRAST  TECHNIQUE: Multidetector CT imaging of the abdomen and pelvis was performed using the standard protocol following bolus administration of intravenous contrast.  CONTRAST:  25mL OMNIPAQUE IOHEXOL 300 MG/ML SOLN, OMNIPAQUE IOHEXOL 300 MG/ML SOLN  COMPARISON:  08/04/2011 CT scan  FINDINGS: Visualized portions of the lung bases are clear. No acute musculoskeletal findings.  Mild L1 compression deformity appears nonacute and is stable from 08/04/2011.  Liver is normal except for 3 mm focus of low attenuation in the posterior dome on the right seen on image 12. This is far too small to characterize and on likely to be of acute clinical significance. Gallbladder is normal. Spleen is normal. Pancreas is normal.  Adrenal glands are normal. Left kidney is normal. Right kidney  shows mild perinephric inflammation exaggerated by motion. There is moderate dilatation of the right renal pelvis, and the right ureter is moderately to severely dilated to a diameter of 1 cm, involving the proximal third of the right ureter. There is a stone at the junction of the proximal and middle thirds of the right ureter, measuring 8 mm. Distally the ureter is decompressed.  Bladder is normal. The uterus is displaced were the right side. There is a soft tissue left adnexal mass measuring 10 x 5 cm. There is no free fluid. Large  and small bowel are normal. Stomach is normal. Appendix is not identified.  IMPRESSION: 1. Moderate to severe hydronephrosis on the right due to an 8 mm stone at the junction of the proximal and middle thirds of the right ureter. 2. 10 x 5 cm pelvic mass, not significantly different when compared to the prior study. 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   Electronically Signed   By: Esperanza Heir M.D.   On: 11/23/2013 10:48     EKG Interpretation None      MDM   Final diagnoses:  Right kidney stone    CT scan reveals a 8 mm stone in the right ureter. Pain control with intravenous pain medicine.  Followup with urologist. Discussed pelvis mass. She will get gynecological followup for this.  Discharge medications Percocet, Phenergan 25 mg, Flomax 0.4 mg  I personally performed the services described in this documentation, which was scribed in my presence. The recorded information has been reviewed and is accurate.     Donnetta Hutching, MD 11/23/13 [phone]

## 2018-09-11 ENCOUNTER — Other Ambulatory Visit: Payer: Self-pay | Admitting: Family Medicine

## 2018-09-11 ENCOUNTER — Other Ambulatory Visit (HOSPITAL_COMMUNITY): Payer: Self-pay | Admitting: Family Medicine

## 2018-09-11 DIAGNOSIS — M545 Low back pain, unspecified: Secondary | ICD-10-CM

## 2018-09-18 ENCOUNTER — Other Ambulatory Visit: Payer: Self-pay

## 2018-09-18 ENCOUNTER — Ambulatory Visit (HOSPITAL_COMMUNITY)
Admission: RE | Admit: 2018-09-18 | Discharge: 2018-09-18 | Disposition: A | Discharge status: Home / Self Care | Payer: Medicare Other | Source: Ambulatory Visit | Attending: Family Medicine | Admitting: Family Medicine

## 2018-09-18 DIAGNOSIS — M545 Low back pain, unspecified: Secondary | ICD-10-CM

## 2019-01-22 ENCOUNTER — Other Ambulatory Visit: Payer: Self-pay

## 2019-01-22 DIAGNOSIS — Z20822 Contact with and (suspected) exposure to covid-19: Secondary | ICD-10-CM

## 2019-01-23 LAB — NOVEL CORONAVIRUS, NAA: SARS-CoV-2, NAA: NOT DETECTED

## 2019-01-24 ENCOUNTER — Telehealth: Payer: Self-pay | Admitting: *Deleted

## 2019-01-24 NOTE — Telephone Encounter (Signed)
Pt given result of COVID test; she verbalized understanding. 

## 2022-04-15 ENCOUNTER — Emergency Department (HOSPITAL_COMMUNITY): Payer: Self-pay

## 2022-04-15 ENCOUNTER — Encounter (HOSPITAL_COMMUNITY): Payer: Self-pay | Admitting: *Deleted

## 2022-04-15 ENCOUNTER — Other Ambulatory Visit: Payer: Self-pay

## 2022-04-15 ENCOUNTER — Observation Stay (HOSPITAL_COMMUNITY)
Admission: EM | Admit: 2022-04-15 | Discharge: 2022-04-16 | Disposition: A | Discharge status: Home / Self Care | Payer: Medicaid Other | Attending: Internal Medicine | Admitting: Internal Medicine

## 2022-04-15 DIAGNOSIS — Z1152 Encounter for screening for COVID-19: Secondary | ICD-10-CM | POA: Insufficient documentation

## 2022-04-15 DIAGNOSIS — F1910 Other psychoactive substance abuse, uncomplicated: Secondary | ICD-10-CM | POA: Insufficient documentation

## 2022-04-15 DIAGNOSIS — F17210 Nicotine dependence, cigarettes, uncomplicated: Secondary | ICD-10-CM | POA: Insufficient documentation

## 2022-04-15 DIAGNOSIS — F1110 Opioid abuse, uncomplicated: Secondary | ICD-10-CM | POA: Diagnosis present

## 2022-04-15 DIAGNOSIS — E876 Hypokalemia: Secondary | ICD-10-CM | POA: Diagnosis not present

## 2022-04-15 DIAGNOSIS — Z91148 Patient's other noncompliance with medication regimen for other reason: Secondary | ICD-10-CM

## 2022-04-15 DIAGNOSIS — Z79899 Other long term (current) drug therapy: Secondary | ICD-10-CM | POA: Insufficient documentation

## 2022-04-15 DIAGNOSIS — I10 Essential (primary) hypertension: Secondary | ICD-10-CM | POA: Diagnosis present

## 2022-04-15 DIAGNOSIS — Z720 Tobacco use: Secondary | ICD-10-CM | POA: Diagnosis present

## 2022-04-15 DIAGNOSIS — R4182 Altered mental status, unspecified: Secondary | ICD-10-CM | POA: Diagnosis not present

## 2022-04-15 DIAGNOSIS — G929 Unspecified toxic encephalopathy: Secondary | ICD-10-CM | POA: Insufficient documentation

## 2022-04-15 DIAGNOSIS — G9340 Encephalopathy, unspecified: Secondary | ICD-10-CM | POA: Insufficient documentation

## 2022-04-15 LAB — CBC
HCT: 42.2 % (ref 36.0–46.0)
Hemoglobin: 14.7 g/dL (ref 12.0–15.0)
MCH: 36.9 pg — ABNORMAL HIGH (ref 26.0–34.0)
MCHC: 34.8 g/dL (ref 30.0–36.0)
MCV: 106 fL — ABNORMAL HIGH (ref 80.0–100.0)
Platelets: 261 10*3/uL (ref 150–400)
RBC: 3.98 MIL/uL (ref 3.87–5.11)
RDW: 13.2 % (ref 11.5–15.5)
WBC: 14.6 10*3/uL — ABNORMAL HIGH (ref 4.0–10.5)
nRBC: 0 % (ref 0.0–0.2)

## 2022-04-15 LAB — COMPREHENSIVE METABOLIC PANEL
ALT: 34 U/L (ref 0–44)
AST: 52 U/L — ABNORMAL HIGH (ref 15–41)
Albumin: 3.8 g/dL (ref 3.5–5.0)
Alkaline Phosphatase: 121 U/L (ref 38–126)
Anion gap: 13 (ref 5–15)
BUN: 11 mg/dL (ref 6–20)
CO2: 24 mmol/L (ref 22–32)
Calcium: 8.5 mg/dL — ABNORMAL LOW (ref 8.9–10.3)
Chloride: 99 mmol/L (ref 98–111)
Creatinine, Ser: 0.8 mg/dL (ref 0.44–1.00)
GFR, Estimated: >60 mL/min (ref >60)
Glucose, Bld: 108 mg/dL — ABNORMAL HIGH (ref 70–99)
Potassium: 2.6 mmol/L — CL (ref 3.5–5.1)
Sodium: 136 mmol/L (ref 135–145)
Total Bilirubin: 0.5 mg/dL (ref 0.3–1.2)
Total Protein: 7.2 g/dL (ref 6.5–8.1)

## 2022-04-15 LAB — RAPID URINE DRUG SCREEN, HOSP PERFORMED
Amphetamines: NOT DETECTED
Barbiturates: NOT DETECTED
Benzodiazepines: POSITIVE — AB
Cocaine: NOT DETECTED
Opiates: NOT DETECTED
Tetrahydrocannabinol: POSITIVE — AB

## 2022-04-15 LAB — RESP PANEL BY RT-PCR (RSV, FLU A&B, COVID)  RVPGX2
Influenza A by PCR: NEGATIVE
Influenza B by PCR: NEGATIVE
Resp Syncytial Virus by PCR: NEGATIVE
SARS Coronavirus 2 by RT PCR: NEGATIVE

## 2022-04-15 LAB — URINALYSIS, ROUTINE W REFLEX MICROSCOPIC
Bacteria, UA: NONE SEEN
Bilirubin Urine: NEGATIVE
Glucose, UA: NEGATIVE mg/dL
Ketones, ur: NEGATIVE mg/dL
Leukocytes,Ua: NEGATIVE
Nitrite: NEGATIVE
Protein, ur: NEGATIVE mg/dL
Specific Gravity, Urine: 1.002 — ABNORMAL LOW (ref 1.005–1.030)
pH: 6 (ref 5.0–8.0)

## 2022-04-15 LAB — CBG MONITORING, ED: Glucose-Capillary: 129 mg/dL — ABNORMAL HIGH (ref 70–99)

## 2022-04-15 LAB — POC URINE PREG, ED: Preg Test, Ur: NEGATIVE

## 2022-04-15 LAB — MAGNESIUM: Magnesium: 1.7 mg/dL (ref 1.7–2.4)

## 2022-04-15 MED ORDER — POTASSIUM CHLORIDE 10 MEQ/100ML IV SOLN
10.0000 meq | INTRAVENOUS | Status: AC
  Administered 2022-04-15 (×2): 10 meq via INTRAVENOUS
  Filled 2022-04-15 (×2): qty 100

## 2022-04-15 MED ORDER — POTASSIUM CHLORIDE CRYS ER 20 MEQ PO TBCR
40.0000 meq | EXTENDED_RELEASE_TABLET | Freq: Once | ORAL | Status: AC
  Administered 2022-04-15: 40 meq via ORAL
  Filled 2022-04-15: qty 2

## 2022-04-15 MED ORDER — ENOXAPARIN SODIUM 40 MG/0.4ML IJ SOSY: 40.0000 mg | PREFILLED_SYRINGE | INTRAMUSCULAR | Status: DC

## 2022-04-15 MED ORDER — BUPRENORPHINE HCL 8 MG SL SUBL
12.0000 mg | SUBLINGUAL_TABLET | Freq: Every day | SUBLINGUAL | Status: DC
  Administered 2022-04-15: 12 mg via SUBLINGUAL
  Filled 2022-04-15: qty 2

## 2022-04-15 MED ORDER — AMLODIPINE BESYLATE 5 MG PO TABS
5.0000 mg | ORAL_TABLET | Freq: Every day | ORAL | Status: DC
  Administered 2022-04-15 – 2022-04-16 (×2): 5 mg via ORAL
  Filled 2022-04-15 (×2): qty 1

## 2022-04-15 MED ORDER — POTASSIUM CHLORIDE 10 MEQ/100ML IV SOLN
10.0000 meq | Freq: Once | INTRAVENOUS | Status: AC
  Administered 2022-04-15: 10 meq via INTRAVENOUS
  Filled 2022-04-15: qty 100

## 2022-04-15 MED ORDER — LACTATED RINGERS IV SOLN: INTRAVENOUS | Status: DC

## 2022-04-15 MED ORDER — POTASSIUM CHLORIDE 10 MEQ/100ML IV SOLN: 10.0000 meq | Freq: Once | INTRAVENOUS | Status: DC

## 2022-04-15 MED ORDER — HYDRALAZINE HCL 20 MG/ML IJ SOLN: 5.0000 mg | Freq: Four times a day (QID) | INTRAMUSCULAR | Status: DC | PRN

## 2022-04-15 MED ORDER — ACETAMINOPHEN 650 MG RE SUPP: 650.0000 mg | Freq: Four times a day (QID) | RECTAL | Status: DC | PRN

## 2022-04-15 MED ORDER — MAGNESIUM SULFATE 2 GM/50ML IV SOLN
2.0000 g | Freq: Once | INTRAVENOUS | Status: AC
  Administered 2022-04-15: 2 g via INTRAVENOUS
  Filled 2022-04-15: qty 50

## 2022-04-15 MED ORDER — ACETAMINOPHEN 325 MG PO TABS: 650.0000 mg | ORAL_TABLET | Freq: Four times a day (QID) | ORAL | Status: DC | PRN

## 2022-04-15 MED ORDER — BUPRENORPHINE HCL 8 MG SL SUBL
8.0000 mg | SUBLINGUAL_TABLET | Freq: Every day | SUBLINGUAL | Status: DC
  Filled 2022-04-15: qty 1

## 2022-04-15 NOTE — H&P (Signed)
TRH H&P   Patient Demographics:    Natalie Fleming, is a 57 y.o. female  MRN: [id number]   DOB - [birth year]  Admit Date - 04/15/2022  Outpatient Primary MD for the patient is Pcp, No  Referring MD/NP/PA: PA Blue  Patient coming from: home  Chief Complaint  Patient presents with   Altered Mental Status      HPI:    Natalie Fleming  is a 57 y.o. female, who presents to to ED for altered mental status started yesterday, patient with increased episodes of confusion yesterday while at work, she refused transport to EMS yesterday, patient with history of substance abuse in the past, she is on Subutex, no significant labs abnormality, CT head with no acute findings, patient is awake alert appropriate upon my evaluation, her urine drug screen came back positive for [facility] which she endorsed using, as well came back positive for benzodiazepine, she initially denied using it, but then later she did confirm she took 2 pills of Xanax yesterday prior to her episode of confusion, her workup significant for potassium of 2.7, surgery and hospitalist consulted to admit for hypokalemia.    Review of systems:      A full 10 point Review of Systems was done, except as stated above, all other Review of Systems were negative.   With Past History of the following :    Past Medical History:  Diagnosis Date   Back pain, chronic    Bipolar 1 disorder ([place])    Degenerative disc disease    Degenerative disc disease    Degenerative disc disease    Degenerative disc disease    Degenerative disc disease    Depression    Peptic ulcer       Past Surgical History:  Procedure Laterality Date   TUBAL LIGATION        Social History:     Social History   Tobacco Use   Smoking status: Every Day    Packs/day: 0.50    Types: Cigarettes   Smokeless tobacco: Not on file  Substance Use Topics   Alcohol use:  Yes    Comment: occasionally       Family History :    History reviewed. No pertinent family history.    Home Medications:   Prior to Admission medications   Medication Sig Start Date End Date Taking? Authorizing Provider  buprenorphine (SUBUTEX) 8 MG SUBL SL tablet Place 8 mg under the tongue See admin instructions. Take 1 tablet in the morning and 1 and 1/2 tablets in the evening 03/19/22  Yes [provider]  ibuprofen (ADVIL,MOTRIN) 200 MG tablet Take 400 mg by mouth daily as needed for headache.   Yes [provider]     Allergies:    No Known Allergies   Physical Exam:   Vitals  Blood pressure (!) 162/89, pulse 85, temperature (!) 97.5  F (36.4 C), resp. rate 13, height 5\' 4"  (1.626 m), weight 59 kg, last menstrual period 10/23/2012, SpO2 98 %.   1. General developed female, lying in bed in NAD,    2. Normal affect and insight, Not Suicidal or Homicidal, Awake Alert, Oriented X 3.  3. No F.N deficits, ALL C.Nerves Intact, Strength 5/5 all 4 extremities, Sensation intact all 4 extremities, Plantars down going.  4. Ears and Eyes appear Normal, Conjunctivae clear, PERRLA. Moist Oral Mucosa.  5. Supple Neck, No JVD, No cervical lymphadenopathy appriciated, No Carotid Bruits.  6. Symmetrical Chest wall movement, Good air movement bilaterally, CTAB.  7. RRR, No Gallops, Rubs or Murmurs, No Parasternal Heave.  8. Positive Bowel Sounds, Abdomen Soft, No tenderness, No organomegaly appriciated,No rebound -guarding or rigidity.  9.  No Cyanosis, Normal Skin Turgor  10. Good muscle tone,  joints appear normal , no effusions, Normal ROM.    Data Review:    CBC Recent Labs  Lab 04/15/22 1356  WBC 14.6*  HGB 14.7  HCT 42.2  PLT 261  MCV 106.0*  MCH 36.9*  MCHC 34.8  RDW 13.2   ------------------------------------------------------------------------------------------------------------------  Chemistries  Recent Labs  Lab 04/15/22 1356  04/15/22 1502  NA 136  --   K 2.6*  --   CL 99  --   CO2 24  --   GLUCOSE 108*  --   BUN 11  --   CREATININE 0.80  --   CALCIUM 8.5*  --   MG  --  1.7  AST 52*  --   ALT 34  --   ALKPHOS 121  --   BILITOT 0.5  --    ------------------------------------------------------------------------------------------------------------------ estimated creatinine clearance is 67 mL/min (by C-G formula based on SCr of 0.8 mg/dL). ------------------------------------------------------------------------------------------------------------------ No results for input(s): "TSH", "T4TOTAL", "T3FREE", "THYROIDAB" in the last 72 hours.  Invalid input(s): "FREET3"  Coagulation profile No results for input(s): "INR", "PROTIME" in the last 168 hours. ------------------------------------------------------------------------------------------------------------------- No results for input(s): "DDIMER" in the last 72 hours. -------------------------------------------------------------------------------------------------------------------  Cardiac Enzymes No results for input(s): "CKMB", "TROPONINI", "MYOGLOBIN" in the last 168 hours.  Invalid input(s): "CK" ------------------------------------------------------------------------------------------------------------------ No results found for: "BNP"   ---------------------------------------------------------------------------------------------------------------  Urinalysis    Component Value Date/Time   COLORURINE STRAW (A) 04/15/2022 1424   APPEARANCEUR CLEAR 04/15/2022 1424   LABSPEC 1.002 (L) 04/15/2022 1424   PHURINE 6.0 04/15/2022 1424   GLUCOSEU NEGATIVE 04/15/2022 1424   HGBUR MODERATE (A) 04/15/2022 1424   BILIRUBINUR NEGATIVE 04/15/2022 1424   KETONESUR NEGATIVE 04/15/2022 1424   PROTEINUR NEGATIVE 04/15/2022 1424   UROBILINOGEN 0.2 11/23/2013 0904   NITRITE NEGATIVE 04/15/2022 1424   LEUKOCYTESUR NEGATIVE 04/15/2022 1424     ----------------------------------------------------------------------------------------------------------------   Imaging Results:    DG Chest 2 View  Result Date: 04/15/2022 CLINICAL DATA:  Mental status change.  Confusion. EXAM: CHEST - 2 VIEW COMPARISON:  Radiographs 04/29/2013 and 11/10/2012. FINDINGS: The heart size and mediastinal contours are stable. The lungs appear mildly hyperinflated with mild central airway thickening. There is no edema, confluent airspace opacity, pleural effusion or pneumothorax. Interval fracture of the right 4th rib laterally which appears nonacute. Focal right paraspinal osteophytes in the lower thoracic spine account for increased density over the spine on the lateral view and are unchanged. No acute osseous findings are seen. Telemetry leads overlie the chest. IMPRESSION: 1. No evidence of acute cardiopulmonary process. Probable chronic obstructive pulmonary disease. 2. Interval fracture of the right 4th rib laterally, likely nonacute. Electronically  Signed   By: Richardean Sale M.D.   On: 04/15/2022 15:24   CT Head Wo Contrast  Result Date: 04/15/2022 CLINICAL DATA:  Altered mental status EXAM: CT HEAD WITHOUT CONTRAST TECHNIQUE: Contiguous axial images were obtained from the base of the skull through the vertex without intravenous contrast. RADIATION DOSE REDUCTION: This exam was performed according to the departmental dose-optimization program which includes automated exposure control, adjustment of the mA and/or kV according to patient size and/or use of iterative reconstruction technique. COMPARISON:  04/19/13 CT Brain FINDINGS: Brain: No evidence of acute infarction, hemorrhage, hydrocephalus, extra-axial collection or mass lesion/mass effect. Asymmetric hypodensity in the left occipital cortex is favored to be artifactual. Vascular: No hyperdense vessel or unexpected calcification. Skull: Normal. Negative for fracture or focal lesion. Sinuses/Orbits: No middle  ear or mastoid effusion. Paranasal sinuses are clear. Orbits are unremarkable. Other: None. IMPRESSION: No acute intracranial abnormality. Electronically Signed   By: Marin Roberts M.D.   On: 04/15/2022 15:19    EKG Vent. rate 94 BPM PR interval 150 ms QRS duration 77 ms QT/QTcB 377/472 ms P-R-T axes 31 25 43 Sinus rhythm Probable anteroseptal infarct, old   Assessment & Plan:    Principal Problem:   AMS (altered mental status) Active Problems:   Acute encephalopathy  Acute toxic encephalopathy -Secondary to substance abuse, mildly including benzos, she endorsed taking 2 pills of Xanax, as well secondary to THC use. -Mentation much improved, she is back to baseline. -CT head with no acute finding  Polysubstance abuse -she is positive for THC, benzo diazepam, she was counseled -Continue with Subutex  Severe hypokalemia -Repleting with IV and p.o., she received IV magnesium as well -Current telemetry   Hypertension  - Blood pressure is elevated, she reports she had does not follow with a PCP for a while now, will start on low-dose Norvasc and as needed hydralazine   DVT Prophylaxis  Lovenox  AM Labs Ordered, also please review Full Orders  Family Communication: Admission, patients condition and plan of care including tests being ordered have been discussed with the patient  who indicate understanding and agree with the plan and Code Status.  Code Status full  Likely DC to  home  Condition GUARDED    Consults called: none    Admission status: observation    Time spent in minutes : 55 minutes   Phillips Climes M.D on 04/15/2022 at 5:26 PM   [facility] - Office  [phone]

## 2022-04-15 NOTE — ED Triage Notes (Signed)
Pt's fiance states pt started having episodes of confusion yesterday while at work; ems came to work and checked pt's vitals but pt refused transport;  fiance states pt came home and still having trouble with ambulation and episodes of confusion;  Pt alert and oriented at this time and denies any pain

## 2022-04-15 NOTE — ED Notes (Signed)
Urine specimen obtained and sent to lab

## 2022-04-15 NOTE — ED Notes (Signed)
Pt has bruises to right upper arm, right breast, right flank area and left flank area  When asked if pt fell she stated she has not fallen but she may have been running into things

## 2022-04-15 NOTE — ED Notes (Signed)
Attempted to call report. Per secretary, bed has not been approved by charge nurse yet. 

## 2022-04-15 NOTE — ED Provider Notes (Signed)
[place] EMERGENCY DEPARTMENT AT [facility] Provider Note   CSN: [id number] Arrival date & time: 04/15/22  1316     History  Chief Complaint  Patient presents with   Altered Mental Status    Natalie Fleming is a 57 y.o. female who presents to the ED with concerns for AMS onset yesterday. Notes that she has had increased episodes of confusion yesterday while at work. Pt refused transport by EMS yesterday. Notes history of similar when she was taking a lot of medications. Denies use of heroin, meth, or cocaine. Notes that she is taking subutex. No new medications noted. Denies chest pain, shortness of breath, abdominal pain, nausea, vomiting, headache, numbness, tingling, weakness, urinary symptoms, dizziness, lightheadedness, rhinorrhea, nasal congestion.   The history is provided by the patient. No language interpreter was used.       Home Medications Prior to Admission medications   Medication Sig Start Date End Date Taking? Authorizing Provider  buprenorphine (SUBUTEX) 8 MG SUBL SL tablet Place 8 mg under the tongue See admin instructions. Take 1 tablet in the morning and 1 and 1/2 tablets in the evening 03/19/22  Yes [provider]  ibuprofen (ADVIL,MOTRIN) 200 MG tablet Take 400 mg by mouth daily as needed for headache.   Yes [provider]      Allergies    Patient has no known allergies.    Review of Systems   Review of Systems  All other systems reviewed and are negative.   Physical Exam Updated Vital Signs BP (!) 158/97   Pulse 99   Temp (!) 97.5 F (36.4 C)   Resp 18   Ht 5\' 4"  (1.626 m)   Wt 59 kg   LMP 10/23/2012   SpO2 98%   BMI 22.31 kg/m  Physical Exam Vitals and nursing note reviewed.  Constitutional:      General: She is not in acute distress.    Appearance: Normal appearance.  Eyes:     General: No scleral icterus.    Extraocular Movements: Extraocular movements intact.  Cardiovascular:     Rate and Rhythm: Normal  rate and regular rhythm.     Pulses: Normal pulses.     Heart sounds: Normal heart sounds.  Pulmonary:     Effort: Pulmonary effort is normal. No respiratory distress.     Breath sounds: Normal breath sounds.  Abdominal:     General: Bowel sounds are normal.     Palpations: Abdomen is soft. There is no mass.     Tenderness: There is no abdominal tenderness.  Musculoskeletal:        General: Normal range of motion.     Cervical back: Neck supple.  Skin:    General: Skin is warm and dry.     Findings: No rash.  Neurological:     Mental Status: She is alert.     Sensory: Sensation is intact.     Motor: Motor function is intact.     Comments: No focal neurological deficits. Negative pronator drift. Able to ambulate without assistance or difficulty. Strength and sensation intact to BUE and BLE. Grip strength 5/5 bilaterally.  Normal finger-nose testing.  Normal heel-to-shin testing.  Cranial nerves II through XII intact. Alert and oriented x 4.   Psychiatric:        Behavior: Behavior normal.     ED Results / Procedures / Treatments   Labs (all labs ordered are listed, but only abnormal results are displayed) Labs Reviewed  COMPREHENSIVE METABOLIC PANEL - Abnormal; Notable for the following components:      Result Value   Potassium 2.6 (*)    Glucose, Bld 108 (*)    Calcium 8.5 (*)    AST 52 (*)    All other components within normal limits  CBC - Abnormal; Notable for the following components:   WBC 14.6 (*)    MCV 106.0 (*)    MCH 36.9 (*)    All other components within normal limits  URINALYSIS, ROUTINE W REFLEX MICROSCOPIC - Abnormal; Notable for the following components:   Color, Urine STRAW (*)    Specific Gravity, Urine 1.002 (*)    Hgb urine dipstick MODERATE (*)    All other components within normal limits  RAPID URINE DRUG SCREEN, HOSP PERFORMED - Abnormal; Notable for the following components:   Benzodiazepines POSITIVE (*)    Tetrahydrocannabinol POSITIVE (*)     All other components within normal limits  CBG MONITORING, ED - Abnormal; Notable for the following components:   Glucose-Capillary 129 (*)    All other components within normal limits  RESP PANEL BY RT-PCR (RSV, FLU A&B, COVID)  RVPGX2  MAGNESIUM  POC URINE PREG, ED    EKG EKG Interpretation  Date/Time:  Friday April 15 2022 13:49:30 EST Ventricular Rate:  94 PR Interval:  150 QRS Duration: 77 QT Interval:  377 QTC Calculation: 472 R Axis:   25 Text Interpretation: Sinus rhythm Probable anteroseptal infarct, old Confirmed by Margaretmary Eddy [phone]) on 04/15/2022 3:01:32 PM  Radiology DG Chest 2 View  Result Date: 04/15/2022 CLINICAL DATA:  Mental status change.  Confusion. EXAM: CHEST - 2 VIEW COMPARISON:  Radiographs 04/29/2013 and 11/10/2012. FINDINGS: The heart size and mediastinal contours are stable. The lungs appear mildly hyperinflated with mild central airway thickening. There is no edema, confluent airspace opacity, pleural effusion or pneumothorax. Interval fracture of the right 4th rib laterally which appears nonacute. Focal right paraspinal osteophytes in the lower thoracic spine account for increased density over the spine on the lateral view and are unchanged. No acute osseous findings are seen. Telemetry leads overlie the chest. IMPRESSION: 1. No evidence of acute cardiopulmonary process. Probable chronic obstructive pulmonary disease. 2. Interval fracture of the right 4th rib laterally, likely nonacute. Electronically Signed   By: Richardean Sale M.D.   On: 04/15/2022 15:24   CT Head Wo Contrast  Result Date: 04/15/2022 CLINICAL DATA:  Altered mental status EXAM: CT HEAD WITHOUT CONTRAST TECHNIQUE: Contiguous axial images were obtained from the base of the skull through the vertex without intravenous contrast. RADIATION DOSE REDUCTION: This exam was performed according to the departmental dose-optimization program which includes automated exposure control, adjustment  of the mA and/or kV according to patient size and/or use of iterative reconstruction technique. COMPARISON:  04/19/13 CT Brain FINDINGS: Brain: No evidence of acute infarction, hemorrhage, hydrocephalus, extra-axial collection or mass lesion/mass effect. Asymmetric hypodensity in the left occipital cortex is favored to be artifactual. Vascular: No hyperdense vessel or unexpected calcification. Skull: Normal. Negative for fracture or focal lesion. Sinuses/Orbits: No middle ear or mastoid effusion. Paranasal sinuses are clear. Orbits are unremarkable. Other: None. IMPRESSION: No acute intracranial abnormality. Electronically Signed   By: Marin Roberts M.D.   On: 04/15/2022 15:19    Procedures Procedures    Medications Ordered in ED Medications  potassium chloride 10 mEq in 100 mL IVPB (10 mEq Intravenous New Bag/Given 04/15/22 1646)  magnesium sulfate IVPB 2 g 50 mL (2 g Intravenous  New Bag/Given 04/15/22 1645)  potassium chloride 10 mEq in 100 mL IVPB (has no administration in time range)  potassium chloride SA (KLOR-CON M) CR tablet 40 mEq (40 mEq Oral Given 04/15/22 1545)    ED Course/ Medical Decision Making/ A&P Clinical Course as of 04/15/22 1709  Fri Apr 15, 2022  1502 Potassium(!!): 2.6 [SB]  1537 Discussed with patient lab findings. Discussed with pt that we will treat with potassium in the ED. Pt agreeable.   [SB]  1610 Discussed with patient plans for admission.  Patient agreeable at this time. [SB]  1701 Consult with Hospitalist, Dr. Waldron Labs who will evaluated for admission.  [SB]    Clinical Course User Index [SB] Edward Trevino A, PA-C                             Medical Decision Making Amount and/or Complexity of Data Reviewed Labs: ordered. Decision-making details documented in ED Course. Radiology: ordered.  Risk Prescription drug management. Decision regarding hospitalization.   Pt presents with concerns for AMS onset yesterday. History of similar symptoms with several  prescribed medications on board. Vital signs, pt afebrile, not tachycardic or hypoxic. On exam, pt with no focal neurological deficits. No acute cardiovascular, respiratory, abdominal exam findings. Differential diagnosis includes anemia, hypoglycemia, intracranial abnormality, electrolyte abnormality, PNA, acute cystitis, arrhythmia.   Co morbidities that complicate the patient evaluation: DDD Bipolar 1 Opiate abuse  Additional history obtained:  Additional history obtained from family at bedside  Labs:  I ordered, and personally interpreted labs.  The pertinent results include:  CBC with leukocytosis at 14.6 otherwise negative CMP with hypokalemia at 2.6 (repleted in the ED) otherwise unremarkable Pregnancy urine negative UDS notable positive for benzodiazepine and THC CBG elevated slightly at 129 COVID swab negative Flu swab negative RSV swab negative Magnesium at 1.7 (repleted in the ED)  Imaging: I ordered imaging studies including CT head, CXR I independently visualized and interpreted imaging which showed: Chest x-ray with  1. No evidence of acute cardiopulmonary process. Probable chronic  obstructive pulmonary disease.  2. Interval fracture of the right 4th rib laterally, likely  nonacute.   CT head with no acute findings I agree with the radiologist interpretation  Medications:  I ordered medication including potassium and magnesium for repletion I have reviewed the patients home medicines and have made adjustments as needed   Consultations: I requested consultation with the Hospitalist, Elgergawy and discussed lab and imaging findings as well as pertinent plan - they recommend: Will evaluate for admission.  Disposition: Presentation suspicious for AMS in the setting of hypokalemia.  Doubt concerns at this time for electrolyte abnormality, acute cystitis, intracranial abnormality, hypoglycemia, anemia, COVID, flu, RSV, pneumonia. After consideration of the  diagnostic results and the patients response to treatment, I feel that the patient would benefit from Admission to the hospital.  Discussed with patient and family plans for admission.  Patient and family agreeable at this time for admission.   This chart was dictated using voice recognition software, Dragon. Despite the best efforts of this provider to proofread and correct errors, errors may still occur which can change documentation meaning.  Final Clinical Impression(s) / ED Diagnoses Final diagnoses:  Altered mental status, unspecified altered mental status type  Hypokalemia    Rx / DC Orders ED Discharge Orders     None         Adhira Jamil A, PA-C 04/15/22 1711  Fransico Meadow, MD 04/17/22 [phone]

## 2022-04-16 DIAGNOSIS — R4182 Altered mental status, unspecified: Secondary | ICD-10-CM

## 2022-04-16 DIAGNOSIS — G9340 Encephalopathy, unspecified: Secondary | ICD-10-CM

## 2022-04-16 DIAGNOSIS — I10 Essential (primary) hypertension: Secondary | ICD-10-CM

## 2022-04-16 DIAGNOSIS — E876 Hypokalemia: Secondary | ICD-10-CM

## 2022-04-16 DIAGNOSIS — Z720 Tobacco use: Secondary | ICD-10-CM

## 2022-04-16 DIAGNOSIS — F1110 Opioid abuse, uncomplicated: Secondary | ICD-10-CM

## 2022-04-16 LAB — CBC
HCT: 39.4 % (ref 36.0–46.0)
Hemoglobin: 13 g/dL (ref 12.0–15.0)
MCH: 36 pg — ABNORMAL HIGH (ref 26.0–34.0)
MCHC: 33 g/dL (ref 30.0–36.0)
MCV: 109.1 fL — ABNORMAL HIGH (ref 80.0–100.0)
Platelets: 226 10*3/uL (ref 150–400)
RBC: 3.61 MIL/uL — ABNORMAL LOW (ref 3.87–5.11)
RDW: 13.3 % (ref 11.5–15.5)
WBC: 7.9 10*3/uL (ref 4.0–10.5)
nRBC: 0 % (ref 0.0–0.2)

## 2022-04-16 LAB — HIV ANTIBODY (ROUTINE TESTING W REFLEX): HIV Screen 4th Generation wRfx: NONREACTIVE

## 2022-04-16 LAB — BASIC METABOLIC PANEL
Anion gap: 7 (ref 5–15)
BUN: 8 mg/dL (ref 6–20)
CO2: 21 mmol/L — ABNORMAL LOW (ref 22–32)
Calcium: 8.2 mg/dL — ABNORMAL LOW (ref 8.9–10.3)
Chloride: 110 mmol/L (ref 98–111)
Creatinine, Ser: 0.85 mg/dL (ref 0.44–1.00)
GFR, Estimated: >60 mL/min (ref >60)
Glucose, Bld: 93 mg/dL (ref 70–99)
Potassium: 4.7 mmol/L (ref 3.5–5.1)
Sodium: 138 mmol/L (ref 135–145)

## 2022-04-16 MED ORDER — PANTOPRAZOLE SODIUM 40 MG PO TBEC: 40.0000 mg | DELAYED_RELEASE_TABLET | Freq: Every day | ORAL | 1 refills | Status: AC

## 2022-04-16 MED ORDER — AMLODIPINE BESYLATE 5 MG PO TABS: 5.0000 mg | ORAL_TABLET | Freq: Every day | ORAL | 1 refills | Status: AC

## 2022-04-16 MED ORDER — NICOTINE 14 MG/24HR TD PT24: 14.0000 mg | MEDICATED_PATCH | TRANSDERMAL | 1 refills | Status: AC

## 2022-04-16 NOTE — Assessment & Plan Note (Signed)
-  Secondary to medications -Mentation improved and back to baseline at time of discharge -Patient instructed to stop the use of nonprescribed drugs and also probably recreational habits. -Outpatient follow-up with primary care provider and instructions to maintain adequate hydration has been given.

## 2022-04-16 NOTE — TOC Transition Note (Signed)
Transition of Care [facility]) - CM/SW Discharge Note   Patient Details  Name: Natalie Fleming MRN: [id number] Date of Birth: 12-07-1965  Transition of Care [facility] CM/SW Contact:  Loreta Ave, [place] Phone Number: 04/16/2022, 12:18 PM   Clinical Narrative:     PCP resource added to AVS for.         Patient Goals and CMS Choice      Discharge Placement                         Discharge Plan and Services Additional resources added to the After Visit Summary for                                       Social Determinants of Health (SDOH) Interventions SDOH Screenings   Food Insecurity: No Food Insecurity (04/15/2022)  Housing: Low Risk  (04/15/2022)  Transportation Needs: No Transportation Needs (04/15/2022)  Utilities: Not At Risk (04/15/2022)  Tobacco Use: High Risk (04/15/2022)     Readmission Risk Interventions     No data to display

## 2022-04-16 NOTE — Assessment & Plan Note (Signed)
-  cessation counseling provided -nicotine patch prescribed at discharge

## 2022-04-16 NOTE — Assessment & Plan Note (Signed)
-  heart healthy diet recommended/discussed  -patient started on amlodipine at discharge -will recommend PCP to reassess BP and further adjust antihypertensive agents as needed.

## 2022-04-16 NOTE — Progress Notes (Signed)
Patient alert and oriented, flat affect. Husband at this time resting in bedside chair. Call bell within reach. Patient without s/s of any distress.

## 2022-04-16 NOTE — Assessment & Plan Note (Signed)
-  Metabolic in nature driven by the use of nonprescribed medications and recreational drugs (marijuana) -Cessation counseling provided and safety medication management discussed. -Patient instructed to maintain adequate hydration and to arrange outpatient follow-up with primary care -Mentation back to baseline and patient oriented x 4 at time of discharge.

## 2022-04-16 NOTE — Progress Notes (Signed)
Patient rested through the night. No acute events during night

## 2022-04-16 NOTE — Assessment & Plan Note (Signed)
-  currently clean and under treatment with subutex clinic -patient advised medication compliance and to keep herself clean.

## 2022-04-16 NOTE — Discharge Summary (Signed)
Physician Discharge Summary   Patient: Natalie Fleming MRN: [id number] DOB: [birth year]  Admit date:     04/15/2022  Discharge date: 04/16/22  Discharge Physician: Barton Dubois   PCP: Pcp, No   Recommendations at discharge:  Repeat basic metabolic panel to follow electrolytes and renal function Visit blood pressure and fluid adjust antihypertensive regimen as needed. Continue assisting patient with tobacco cessation  Discharge Diagnoses: Principal Problem:   AMS (altered mental status) Active Problems:   Opiate abuse, continuous (HCC)   Acute encephalopathy   Tobacco abuse   HTN (hypertension)   Hypokalemia  Hospital Course: As per H&P written by Dr. Waldron Labs on 04/15/22  Natalie Fleming  is a 57 y.o. female, who presents to to ED for altered mental status started yesterday, patient with increased episodes of confusion yesterday while at work, she refused transport to EMS yesterday, patient with history of substance abuse in the past, she is on Subutex, no significant labs abnormality, CT head with no acute findings, patient is awake alert appropriate upon my evaluation, her urine drug screen came back positive for [facility] which she endorsed using, as well came back positive for benzodiazepine, she initially denied using it, but then later she did confirm she took 2 pills of Xanax yesterday prior to her episode of confusion, her workup significant for potassium of 2.7, surgery and hospitalist consulted to admit for hypokalemia.   Assessment and Plan: * AMS (altered mental status) -Secondary to medications -Mentation improved and back to baseline at time of discharge -Patient instructed to stop the use of nonprescribed drugs and also probably recreational habits. -Outpatient follow-up with primary care provider and instructions to maintain adequate hydration has been given.  Opiate abuse, continuous ([place]) -currently clean and under treatment with subutex clinic -patient advised medication  compliance and to keep herself clean.  Hypokalemia -Repleted and WNL at discharge -Mg stable.  HTN (hypertension) -heart healthy diet recommended/discussed  -patient started on amlodipine at discharge -will recommend PCP to reassess BP and further adjust antihypertensive agents as needed.  Tobacco abuse -cessation counseling provided -nicotine patch prescribed at discharge  Acute encephalopathy -Metabolic in nature driven by the use of nonprescribed medications and recreational drugs (marijuana) -Cessation counseling provided and safety medication management discussed. -Patient instructed to maintain adequate hydration and to arrange outpatient follow-up with primary care -Mentation back to baseline and patient oriented x 4 at time of discharge.  Consultants: None Procedures performed: See below for x-ray reports. Disposition: Home Diet recommendation: Heart healthy/low-sodium diet.  DISCHARGE MEDICATION: Allergies as of 04/16/2022   No Known Allergies      Medication List     TAKE these medications    amLODipine 5 MG tablet Commonly known as: NORVASC Take 1 tablet (5 mg total) by mouth daily. Start taking on: April 17, 2022   buprenorphine 8 MG Subl SL tablet Commonly known as: SUBUTEX Place 8 mg under the tongue See admin instructions. Take 1 tablet in the morning and 1 and 1/2 tablets in the evening   ibuprofen 200 MG tablet Commonly known as: ADVIL Take 400 mg by mouth daily as needed for headache.   nicotine 14 mg/24hr patch Commonly known as: NICODERM CQ - dosed in mg/24 hours Place 1 patch (14 mg total) onto the skin daily.   pantoprazole 40 MG tablet Commonly known as: Protonix Take 1 tablet (40 mg total) by mouth daily.        Discharge Exam: Filed Weights   04/15/22 1333  Weight: 59  kg   General exam: Alert, awake, oriented x 3 Respiratory system: Clear to auscultation. Respiratory effort normal. Cardiovascular system:RRR. No rubs or  gallops; no JVD. Gastrointestinal system: Abdomen is nondistended, soft and nontender. No organomegaly or masses felt. Normal bowel sounds heard. Central nervous system: Alert and oriented. No focal neurological deficits. Extremities: No C/C/E, +pedal pulses Skin: No rashes, lesions or ulcers Psychiatry: Judgement and insight appear normal. Mood & affect appropriate.    Condition at discharge: Stable and improved.  The results of significant diagnostics from this hospitalization (including imaging, microbiology, ancillary and laboratory) are listed below for reference.   Imaging Studies: DG Chest 2 View  Result Date: 04/15/2022 CLINICAL DATA:  Mental status change.  Confusion. EXAM: CHEST - 2 VIEW COMPARISON:  Radiographs 04/29/2013 and 11/10/2012. FINDINGS: The heart size and mediastinal contours are stable. The lungs appear mildly hyperinflated with mild central airway thickening. There is no edema, confluent airspace opacity, pleural effusion or pneumothorax. Interval fracture of the right 4th rib laterally which appears nonacute. Focal right paraspinal osteophytes in the lower thoracic spine account for increased density over the spine on the lateral view and are unchanged. No acute osseous findings are seen. Telemetry leads overlie the chest. IMPRESSION: 1. No evidence of acute cardiopulmonary process. Probable chronic obstructive pulmonary disease. 2. Interval fracture of the right 4th rib laterally, likely nonacute. Electronically Signed   By: Richardean Sale M.D.   On: 04/15/2022 15:24   CT Head Wo Contrast  Result Date: 04/15/2022 CLINICAL DATA:  Altered mental status EXAM: CT HEAD WITHOUT CONTRAST TECHNIQUE: Contiguous axial images were obtained from the base of the skull through the vertex without intravenous contrast. RADIATION DOSE REDUCTION: This exam was performed according to the departmental dose-optimization program which includes automated exposure control, adjustment of the mA  and/or kV according to patient size and/or use of iterative reconstruction technique. COMPARISON:  04/19/13 CT Brain FINDINGS: Brain: No evidence of acute infarction, hemorrhage, hydrocephalus, extra-axial collection or mass lesion/mass effect. Asymmetric hypodensity in the left occipital cortex is favored to be artifactual. Vascular: No hyperdense vessel or unexpected calcification. Skull: Normal. Negative for fracture or focal lesion. Sinuses/Orbits: No middle ear or mastoid effusion. Paranasal sinuses are clear. Orbits are unremarkable. Other: None. IMPRESSION: No acute intracranial abnormality. Electronically Signed   By: Marin Roberts M.D.   On: 04/15/2022 15:19    Microbiology: Results for orders placed or performed during the hospital encounter of 04/15/22  Resp panel by RT-PCR (RSV, Flu A&B, Covid) Anterior Nasal Swab     Status: None   Collection Time: 04/15/22  2:37 PM   Specimen: Anterior Nasal Swab  Result Value Ref Range Status   SARS Coronavirus 2 by RT PCR NEGATIVE NEGATIVE Final    Comment: (NOTE) SARS-CoV-2 target nucleic acids are NOT DETECTED.  The SARS-CoV-2 RNA is generally detectable in upper respiratory specimens during the acute phase of infection. The lowest concentration of SARS-CoV-2 viral copies this assay can detect is 138 copies/mL. A negative result does not preclude SARS-Cov-2 infection and should not be used as the sole basis for treatment or other patient management decisions. A negative result may occur with  improper specimen collection/handling, submission of specimen other than nasopharyngeal swab, presence of viral mutation(s) within the areas targeted by this assay, and inadequate number of viral copies(<138 copies/mL). A negative result must be combined with clinical observations, patient history, and epidemiological information. The expected result is Negative.  Fact Sheet for Patients:  EntrepreneurPulse.com.au  Fact Sheet  for  Healthcare Providers:  IncredibleEmployment.be  This test is no t yet approved or cleared by the [place] and  has been authorized for detection and/or diagnosis of SARS-CoV-2 by FDA under an Emergency Use Authorization (EUA). This EUA will remain  in effect (meaning this test can be used) for the duration of the COVID-19 declaration under Section 564(b)(1) of the Act, 21 U.S.C.section 360bbb-3(b)(1), unless the authorization is terminated  or revoked sooner.       Influenza A by PCR NEGATIVE NEGATIVE Final   Influenza B by PCR NEGATIVE NEGATIVE Final    Comment: (NOTE) The Xpert Xpress SARS-CoV-2/FLU/RSV plus assay is intended as an aid in the diagnosis of influenza from Nasopharyngeal swab specimens and should not be used as a sole basis for treatment. Nasal washings and aspirates are unacceptable for Xpert Xpress SARS-CoV-2/FLU/RSV testing.  Fact Sheet for Patients: EntrepreneurPulse.com.au  Fact Sheet for Healthcare Providers: IncredibleEmployment.be  This test is not yet approved or cleared by the [place] FDA and has been authorized for detection and/or diagnosis of SARS-CoV-2 by FDA under an Emergency Use Authorization (EUA). This EUA will remain in effect (meaning this test can be used) for the duration of the COVID-19 declaration under Section 564(b)(1) of the Act, 21 U.S.C. section 360bbb-3(b)(1), unless the authorization is terminated or revoked.     Resp Syncytial Virus by PCR NEGATIVE NEGATIVE Final    Comment: (NOTE) Fact Sheet for Patients: EntrepreneurPulse.com.au  Fact Sheet for Healthcare Providers: IncredibleEmployment.be  This test is not yet approved or cleared by the [place] FDA and has been authorized for detection and/or diagnosis of SARS-CoV-2 by FDA under an Emergency Use Authorization (EUA). This EUA will remain in effect (meaning this  test can be used) for the duration of the COVID-19 declaration under Section 564(b)(1) of the Act, 21 U.S.C. section 360bbb-3(b)(1), unless the authorization is terminated or revoked.  Performed at [facility], [address], [place], [place] [address]     Labs: CBC: Recent Labs  Lab 04/15/22 1356 04/16/22 0421  WBC 14.6* 7.9  HGB 14.7 13.0  HCT 42.2 39.4  MCV 106.0* 109.1*  PLT 261 884   Basic Metabolic Panel: Recent Labs  Lab 04/15/22 1356 04/15/22 1502 04/16/22 0421  NA 136  --  138  K 2.6*  --  4.7  CL 99  --  110  CO2 24  --  21*  GLUCOSE 108*  --  93  BUN 11  --  8  CREATININE 0.80  --  0.85  CALCIUM 8.5*  --  8.2*  MG  --  1.7  --    Liver Function Tests: Recent Labs  Lab 04/15/22 1356  AST 52*  ALT 34  ALKPHOS 121  BILITOT 0.5  PROT 7.2  ALBUMIN 3.8   CBG: Recent Labs  Lab 04/15/22 1340  GLUCAP 129*    Discharge time spent: greater than 30 minutes.  Signed: Barton Dubois, MD [facility] 04/16/2022

## 2022-04-16 NOTE — Assessment & Plan Note (Signed)
-  Repleted and WNL at discharge -Mg stable.

## 2022-06-14 ENCOUNTER — Emergency Department (HOSPITAL_COMMUNITY)
Admission: EM | Admit: 2022-06-14 | Discharge: 2022-06-14 | Disposition: A | Discharge status: Home / Self Care | Payer: Self-pay

## 2022-09-27 ENCOUNTER — Telehealth: Payer: Self-pay

## 2022-09-27 NOTE — Telephone Encounter (Signed)
Attempted to call for follow up, no answer. Noted that D. Early of [employer] also attempting to connect for full enrollment to [employer]. Client was dual enrolled from [facility].  Next appointment showing is for 03/23/23. FP Medicaid only per [employer]   Francee Nodal RN  Clara Gunn/[employer]
